# Patient Record
Sex: Female | Born: 1995
Health system: Southern US, Community
[De-identification: ages and names within clinical notes are randomized; demographics above are authoritative.]

## PROBLEM LIST (undated history)

## (undated) DIAGNOSIS — I10 Essential (primary) hypertension: Secondary | ICD-10-CM

## (undated) DIAGNOSIS — A749 Chlamydial infection, unspecified: Secondary | ICD-10-CM

## (undated) HISTORY — PX: FOOT SURGERY: SHX648

## (undated) HISTORY — DX: Chlamydial infection, unspecified: A74.9

## (undated) HISTORY — DX: Essential (primary) hypertension: I10

---
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
## 2015-08-19 ENCOUNTER — Encounter (HOSPITAL_COMMUNITY): Payer: Self-pay | Admitting: Emergency Medicine

## 2015-08-19 ENCOUNTER — Emergency Department (INDEPENDENT_AMBULATORY_CARE_PROVIDER_SITE_OTHER)
Admission: EM | Admit: 2015-08-19 | Discharge: 2015-08-19 | Disposition: A | Discharge status: Home / Self Care | Payer: PRIVATE HEALTH INSURANCE | Source: Home / Self Care | Attending: Family Medicine | Admitting: Family Medicine

## 2015-08-19 ENCOUNTER — Other Ambulatory Visit (HOSPITAL_COMMUNITY)
Admission: RE | Admit: 2015-08-19 | Discharge: 2015-08-19 | Disposition: A | Discharge status: Home / Self Care | Payer: PRIVATE HEALTH INSURANCE | Source: Ambulatory Visit | Attending: Family Medicine | Admitting: Family Medicine

## 2015-08-19 DIAGNOSIS — R0789 Other chest pain: Secondary | ICD-10-CM

## 2015-08-19 DIAGNOSIS — Z113 Encounter for screening for infections with a predominantly sexual mode of transmission: Secondary | ICD-10-CM | POA: Insufficient documentation

## 2015-08-19 DIAGNOSIS — B9689 Other specified bacterial agents as the cause of diseases classified elsewhere: Secondary | ICD-10-CM

## 2015-08-19 DIAGNOSIS — M940 Chondrocostal junction syndrome [Tietze]: Secondary | ICD-10-CM

## 2015-08-19 DIAGNOSIS — N898 Other specified noninflammatory disorders of vagina: Secondary | ICD-10-CM | POA: Diagnosis not present

## 2015-08-19 DIAGNOSIS — N760 Acute vaginitis: Secondary | ICD-10-CM

## 2015-08-19 DIAGNOSIS — R102 Pelvic and perineal pain: Secondary | ICD-10-CM | POA: Diagnosis not present

## 2015-08-19 DIAGNOSIS — N72 Inflammatory disease of cervix uteri: Secondary | ICD-10-CM

## 2015-08-19 DIAGNOSIS — A499 Bacterial infection, unspecified: Secondary | ICD-10-CM

## 2015-08-19 LAB — POCT URINALYSIS DIP (DEVICE)
Bilirubin Urine: NEGATIVE
Glucose, UA: NEGATIVE mg/dL
HGB URINE DIPSTICK: NEGATIVE
Ketones, ur: NEGATIVE mg/dL
LEUKOCYTES UA: NEGATIVE
NITRITE: NEGATIVE
Protein, ur: NEGATIVE mg/dL
Specific Gravity, Urine: 1.02 (ref 1.005–1.030)
UROBILINOGEN UA: 0.2 mg/dL (ref 0.0–1.0)
pH: 6 (ref 5.0–8.0)

## 2015-08-19 LAB — POCT PREGNANCY, URINE: Preg Test, Ur: NEGATIVE

## 2015-08-19 MED ORDER — METRONIDAZOLE 500 MG PO TABS: 500.0000 mg | ORAL_TABLET | Freq: Two times a day (BID) | ORAL | Status: DC

## 2015-08-19 MED ORDER — CEFTRIAXONE SODIUM 250 MG IJ SOLR
250.0000 mg | Freq: Once | INTRAMUSCULAR | Status: AC
  Administered 2015-08-19: 250 mg via INTRAMUSCULAR

## 2015-08-19 MED ORDER — LIDOCAINE HCL (PF) 1 % IJ SOLN
INTRAMUSCULAR | Status: AC
  Filled 2015-08-19: qty 5

## 2015-08-19 MED ORDER — AZITHROMYCIN 250 MG PO TABS
ORAL_TABLET | ORAL | Status: AC
  Filled 2015-08-19: qty 4

## 2015-08-19 MED ORDER — AZITHROMYCIN 250 MG PO TABS
1000.0000 mg | ORAL_TABLET | Freq: Every day | ORAL | Status: DC
  Administered 2015-08-19: 1000 mg via ORAL

## 2015-08-19 MED ORDER — CEFTRIAXONE SODIUM 250 MG IJ SOLR
INTRAMUSCULAR | Status: AC
  Filled 2015-08-19: qty 250
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
## 2015-08-19 NOTE — ED Provider Notes (Signed)
CSN: [id number]     Arrival date & time 08/19/15  1338 History   First MD Initiated Contact with Patient 08/19/15 1533     Chief Complaint  Patient presents with  . Chest Pain   (Consider location/radiation/quality/duration/timing/severity/associated sxs/prior Treatment) HPI Comments: 19 year old female presents with 2 complaints. 1. First complaint is that of right chest wall pain. Pain is well localized and nonradiating. Located to the right of the sternal border. It is made worse by taking deep breaths, abducting the right arm. She describes it as sharp. He comes and goes. It started approximately 3-4 days ago. No history of trauma or injury.  Second complaint is that of pelvic pain for a week. She has a scant amount of milky discharge. She describes the pelvic pain has cramping and intermittent. Denies dysuria but has urinary frequency. She is currently using X [place] on S birth control.   History reviewed. No pertinent past medical history. History reviewed. No pertinent past surgical history. No family history on file. Social History  Substance Use Topics  . Smoking status: Never Smoker   . Smokeless tobacco: None  . Alcohol Use: No   OB History    No data available     Review of Systems  Constitutional: Negative for fever, activity change and fatigue.  HENT: Negative.   Respiratory: Negative.   Cardiovascular: Positive for chest pain. Negative for leg swelling.  Gastrointestinal: Negative.   Genitourinary: Positive for frequency, vaginal discharge and pelvic pain. Negative for dysuria, urgency, hematuria, decreased urine volume and vaginal bleeding.  Musculoskeletal: Negative.   Neurological: Negative.     Allergies  Review of patient's allergies indicates no known allergies.  Home Medications   Prior to Admission medications   Medication Sig Start Date End Date Taking? Authorizing Provider  methocarbamol (ROBAXIN) 500 MG tablet Take 1 tablet (500 mg total) by mouth  2 (two) times daily. 09/03/13   Heather Laisure, PA-C  metroNIDAZOLE (FLAGYL) 500 MG tablet Take 1 tablet (500 mg total) by mouth 2 (two) times daily. X 7 days 08/19/15   Hayden Rasmussen, NP  naproxen (NAPROSYN) 500 MG tablet Take 1 tablet (500 mg total) by mouth 2 (two) times daily. 09/03/13   Santiago Glad, PA-C   Meds Ordered and Administered this Visit   Medications  cefTRIAXone (ROCEPHIN) injection 250 mg (not administered)  azithromycin (ZITHROMAX) tablet 1,000 mg (not administered)    BP 137/90 mmHg  Pulse 89  Temp(Src) 98.2 F (36.8 C) (Oral)  Resp 16  SpO2 100%  LMP 06/02/2015 No data found.   Physical Exam  Constitutional: She is oriented to person, place, and time. She appears well-developed and well-nourished. No distress.  Eyes: Conjunctivae and EOM are normal.  Neck: Normal range of motion. Neck supple.  Cardiovascular: Normal rate, regular rhythm and normal heart sounds.   Pulmonary/Chest: Effort normal and breath sounds normal. No respiratory distress. She has no wheezes. She exhibits tenderness.  Palpation over the right upper sternal border and right upper chest reproduces the pain for which she presents.  Abdominal: Soft. Bowel sounds are normal. She exhibits no distension. There is no tenderness. There is no guarding.  Anterior palpation of the suprapubic midline produces mild tenderness.  Genitourinary: Vaginal discharge found.  Normal external female genital with a small amount of thin white vaginal discharge draining from the introitus. Vaginal walls and cervix coated with a copious amount of thin white malodorous vaginal discharge.  Cervix left of midline. Ectocervix with minor erythema. No lesions.  Positive for CMT and bilateral adnexal tenderness.   Musculoskeletal: She exhibits no edema.  Neurological: She is alert and oriented to person, place, and time. She exhibits normal muscle tone.  Skin: Skin is warm and dry.  Psychiatric: She has a normal mood and  affect.  Nursing note and vitals reviewed.   ED Course  Procedures (including critical care time)  Labs Review Labs Reviewed  POCT URINALYSIS DIP (DEVICE)  POCT PREGNANCY, URINE  CERVICOVAGINAL ANCILLARY ONLY   Results for orders placed or performed during the hospital encounter of 08/19/15  POCT urinalysis dip (device)  Result Value Ref Range   Glucose, UA NEGATIVE NEGATIVE mg/dL   Bilirubin Urine NEGATIVE NEGATIVE   Ketones, ur NEGATIVE NEGATIVE mg/dL   Specific Gravity, Urine 1.020 1.005 - 1.030   Hgb urine dipstick NEGATIVE NEGATIVE   pH 6.0 5.0 - 8.0   Protein, ur NEGATIVE NEGATIVE mg/dL   Urobilinogen, UA 0.2 0.0 - 1.0 mg/dL   Nitrite NEGATIVE NEGATIVE   Leukocytes, UA NEGATIVE NEGATIVE  Pregnancy, urine POC  Result Value Ref Range   Preg Test, Ur NEGATIVE NEGATIVE     Imaging Review No results found.   Visual Acuity Review  Right Eye Distance:   Left Eye Distance:   Bilateral Distance:    Right Eye Near:   Left Eye Near:    Bilateral Near:         MDM   1. Chest wall pain   2. Costochondritis   3. Vaginal discharge   4. Pelvic pain in female   5. Cervicitis   6. BV (bacterial vaginosis)    Rocephin 250 mg IM Azithromycin 1 g by mouth Flagyl 500 mg twice a day per Rx Ice to the front of the chest where it is sore May take ibuprofen or Aleve for chest wall pain. Cervical cytology pending   Hayden Rasmussen, NP 08/19/15 [phone]

## 2015-08-19 NOTE — Discharge Instructions (Signed)
Cervicitis Cervicitis is a soreness and swelling (inflammation) of the cervix. Your cervix is located at the bottom of your uterus. It opens up to the vagina. CAUSES   Sexually transmitted infections (STIs).   Allergic reaction.   Medicines or birth control devices that are put in the vagina.   Injury to the cervix.   Bacterial infections.  RISK FACTORS You are at greater risk if you:  Have unprotected sexual intercourse.  Have sexual intercourse with many partners.  Began sexual intercourse at an early age.  Have a history of STIs. SYMPTOMS  There may be no symptoms. If symptoms occur, they may include:   Gray, white, yellow, or bad-smelling vaginal discharge.   Pain or itching of the area outside the vagina.   Painful sexual intercourse.   Lower abdominal or lower back pain, especially during intercourse.   Frequent urination.   Abnormal vaginal bleeding between periods, after sexual intercourse, or after menopause.   Pressure or a heavy feeling in the pelvis.  DIAGNOSIS  Diagnosis is made after a pelvic exam. Other tests may include:   Examination of any discharge under a microscope (wet prep).   A Pap test.  TREATMENT  Treatment will depend on the cause of cervicitis. If it is caused by an STI, both you and your partner will need to be treated. Antibiotic medicines will be given.  HOME CARE INSTRUCTIONS   Do not have sexual intercourse until your health care provider says it is okay.   Do not have sexual intercourse until your partner has been treated, if your cervicitis is caused by an STI.   Take your antibiotics as directed. Finish them even if you start to feel better.  SEEK MEDICAL CARE IF:  Your symptoms come back.   You have a fever.  MAKE SURE YOU:   Understand these instructions.  Will watch your condition.  Will get help right away if you are not doing well or get worse. Document Released: 11/02/2005 Document Revised:  11/07/2013 Document Reviewed: 04/26/2013 [facility] Patient Information 2015 [place], [place]. This information is not intended to replace advice given to you by your health care provider. Make sure you discuss any questions you have with your health care provider.  Chest Wall Pain Chest wall pain is pain in or around the bones and muscles of your chest. It may take up to 6 weeks to get better. It may take longer if you must stay physically active in your work and activities.  CAUSES  Chest wall pain may happen on its own. However, it may be caused by:  A viral illness like the flu.  Injury.  Coughing.  Exercise.  Arthritis.  Fibromyalgia.  Shingles. HOME CARE INSTRUCTIONS   Avoid overtiring physical activity. Try not to strain or perform activities that cause pain. This includes any activities using your chest or your abdominal and side muscles, especially if heavy weights are used.  Put ice on the sore area.  Put ice in a plastic bag.  Place a towel between your skin and the bag.  Leave the ice on for 15-20 minutes per hour while awake for the first 2 days.  Only take over-the-counter or prescription medicines for pain, discomfort, or fever as directed by your caregiver. SEEK IMMEDIATE MEDICAL CARE IF:   Your pain increases, or you are very uncomfortable.  You have a fever.  Your chest pain becomes worse.  You have new, unexplained symptoms.  You have nausea or vomiting.  You feel  sweaty or lightheaded.  You have a cough with phlegm (sputum), or you cough up blood. MAKE SURE YOU:   Understand these instructions.  Will watch your condition.  Will get help right away if you are not doing well or get worse. Document Released: 11/02/2005 Document Revised: 01/25/2012 Document Reviewed: 06/29/2011 [facility] Patient Information 2015 [place], [place]. This information is not intended to replace advice given to you by your health care provider. Make sure you discuss any  questions you have with your health care provider.  Costochondritis Costochondritis, sometimes called Tietze syndrome, is a swelling and irritation (inflammation) of the tissue (cartilage) that connects your ribs with your breastbone (sternum). It causes pain in the chest and rib area. Costochondritis usually goes away on its own over time. It can take up to 6 weeks or longer to get better, especially if you are unable to limit your activities. CAUSES  Some cases of costochondritis have no known cause. Possible causes include:  Injury (trauma).  Exercise or activity such as lifting.  Severe coughing. SIGNS AND SYMPTOMS  Pain and tenderness in the chest and rib area.  Pain that gets worse when coughing or taking deep breaths.  Pain that gets worse with specific movements. DIAGNOSIS  Your health care provider will do a physical exam and ask about your symptoms. Chest X-rays or other tests may be done to rule out other problems. TREATMENT  Costochondritis usually goes away on its own over time. Your health care provider may prescribe medicine to help relieve pain. HOME CARE INSTRUCTIONS   Avoid exhausting physical activity. Try not to strain your ribs during normal activity. This would include any activities using chest, abdominal, and side muscles, especially if heavy weights are used.  Apply ice to the affected area for the first 2 days after the pain begins.  Put ice in a plastic bag.  Place a towel between your skin and the bag.  Leave the ice on for 20 minutes, 2-3 times a day.  Only take over-the-counter or prescription medicines as directed by your health care provider. SEEK MEDICAL CARE IF:  You have redness or swelling at the rib joints. These are signs of infection.  Your pain does not go away despite rest or medicine. SEEK IMMEDIATE MEDICAL CARE IF:   Your pain increases or you are very uncomfortable.  You have shortness of breath or difficulty breathing.  You  cough up blood.  You have worse chest pains, sweating, or vomiting.  You have a fever or persistent symptoms for more than 2-3 days.  You have a fever and your symptoms suddenly get worse. MAKE SURE YOU:   Understand these instructions.  Will watch your condition.  Will get help right away if you are not doing well or get worse. Document Released: 08/12/2005 Document Revised: 08/23/2013 Document Reviewed: 06/06/2013 [facility] Patient Information 2015 [place], [place]. This information is not intended to replace advice given to you by your health care provider. Make sure you discuss any questions you have with your health care provider.  Pelvic Inflammatory Disease Pelvic inflammatory disease (PID) refers to an infection in some or all of the female organs. The infection can be in the uterus, ovaries, fallopian tubes, or the surrounding tissues in the pelvis. PID can cause abdominal or pelvic pain that comes on suddenly (acute pelvic pain). PID is a serious infection because it can lead to lasting (chronic) pelvic pain or the inability to have children (infertile).  CAUSES  The infection is often  caused by the normal bacteria found in the vaginal tissues. PID may also be caused by an infection that is spread during sexual contact. PID can also occur following:   The birth of a baby.   A miscarriage.   An abortion.   Major pelvic surgery.   The use of an intrauterine device (IUD).   A sexual assault.  RISK FACTORS Certain factors can put a person at higher risk for PID, such as:  Being younger than 25 years.  Being sexually active at [place] age.  Usingnonbarrier contraception.  Havingmultiple sexual partners.  Having sex with someone who has symptoms of a genital infection.  Using oral contraception. Other times, certain behaviors can increase the possibility of getting PID, such as:  Having sex during your period.  Using a vaginal douche.  Having an  intrauterine device (IUD) in place. SYMPTOMS   Abdominal or pelvic pain.   Fever.   Chills.   Abnormal vaginal discharge.  Abnormal uterine bleeding.   Unusual pain shortly after finishing your period. DIAGNOSIS  Your caregiver will choose some of the following methods to make a diagnosis, such as:   Performinga physical exam and history. A pelvic exam typically reveals a very tender uterus and surrounding pelvis.   Ordering laboratory tests including a pregnancy test, blood tests, and urine test.  Orderingcultures of the vagina and cervix to check for a sexually transmitted infection (STI).  Performing an ultrasound.   Performing a laparoscopic procedure to look inside the pelvis.  TREATMENT   Antibiotic medicines may be prescribed and taken by mouth.   Sexual partners may be treated when the infection is caused by a sexually transmitted disease (STD).   Hospitalization may be needed to give antibiotics intravenously.  Surgery may be needed, but this is rare. It may take weeks until you are completely well. If you are diagnosed with PID, you should also be checked for human immunodeficiency virus (HIV). HOME CARE INSTRUCTIONS   If given, take your antibiotics as directed. Finish the medicine even if you start to feel better.   Only take over-the-counter or prescription medicines for pain, discomfort, or fever as directed by your caregiver.   Do not have sexual intercourse until treatment is completed or as directed by your caregiver. If PID is confirmed, your recent sexual partner(s) will need treatment.   Keep your follow-up appointments. SEEK MEDICAL CARE IF:   You have increased or abnormal vaginal discharge.   You need prescription medicine for your pain.   You vomit.   You cannot take your medicines.   Your partner has an STD.  SEEK IMMEDIATE MEDICAL CARE IF:   You have a fever.   You have increased abdominal or pelvic pain.    You have chills.   You have pain when you urinate.   You are not better after 72 hours following treatment.  MAKE SURE YOU:   Understand these instructions.  Will watch your condition.  Will get help right away if you are not doing well or get worse. Document Released: 11/02/2005 Document Revised: 02/27/2013 Document Reviewed: 10/29/2011 [facility] Patient Information 2015 [place], [place]. This information is not intended to replace advice given to you by your health care provider. Make sure you discuss any questions you have with your health care provider.

## 2015-08-19 NOTE — ED Notes (Signed)
Delay in patient discharge, no ready for discharge.  Patient delayed post injection wait and assessment for possible reaction

## 2015-08-20 LAB — CERVICOVAGINAL ANCILLARY ONLY
Chlamydia: NEGATIVE
Neisseria Gonorrhea: NEGATIVE

## 2015-08-21 LAB — CERVICOVAGINAL ANCILLARY ONLY: Wet Prep (BD Affirm): POSITIVE — AB

## 2015-08-26 NOTE — ED Notes (Signed)
Final report of vaginal swab testing positive for gardnerella only. Treatment adequate w Rx provided day of visit

## 2018-04-28 DIAGNOSIS — J019 Acute sinusitis, unspecified: Secondary | ICD-10-CM | POA: Diagnosis not present

## 2018-05-16 DIAGNOSIS — Z113 Encounter for screening for infections with a predominantly sexual mode of transmission: Secondary | ICD-10-CM | POA: Diagnosis not present

## 2018-05-16 DIAGNOSIS — Z01419 Encounter for gynecological examination (general) (routine) without abnormal findings: Secondary | ICD-10-CM | POA: Diagnosis not present

## 2018-05-16 DIAGNOSIS — Z114 Encounter for screening for human immunodeficiency virus [HIV]: Secondary | ICD-10-CM | POA: Diagnosis not present

## 2018-05-16 DIAGNOSIS — Z1159 Encounter for screening for other viral diseases: Secondary | ICD-10-CM | POA: Diagnosis not present

## 2018-05-16 DIAGNOSIS — I272 Pulmonary hypertension, unspecified: Secondary | ICD-10-CM | POA: Insufficient documentation

## 2018-05-16 DIAGNOSIS — Z118 Encounter for screening for other infectious and parasitic diseases: Secondary | ICD-10-CM | POA: Diagnosis not present

## 2018-05-16 DIAGNOSIS — I10 Essential (primary) hypertension: Secondary | ICD-10-CM | POA: Diagnosis not present

## 2018-05-16 DIAGNOSIS — Z6836 Body mass index (BMI) 36.0-36.9, adult: Secondary | ICD-10-CM | POA: Diagnosis not present

## 2018-06-14 NOTE — Progress Notes (Deleted)
Referring-Sheronette Cousins MD Reason for referral-hypertension  HPI: [age] year old female for evaluation of hypertension at request of Maxie BetterSheronette Cousins MD.   Current Outpatient Medications  Medication Sig Dispense Refill  . methocarbamol (ROBAXIN) 500 MG tablet Take 1 tablet (500 mg total) by mouth 2 (two) times daily. 20 tablet 0  . metroNIDAZOLE (FLAGYL) 500 MG tablet Take 1 tablet (500 mg total) by mouth 2 (two) times daily. X 7 days 14 tablet 0  . naproxen (NAPROSYN) 500 MG tablet Take 1 tablet (500 mg total) by mouth 2 (two) times daily. 30 tablet 0   No current facility-administered medications for this visit.     No Known Allergies  Past Medical History:  Diagnosis Date  . Chlamydia   . Hypertension   . Pulmonary hypertension, unspecified (HCC) 06/07/2018    No past surgical history on file.  Social History   Socioeconomic History  . Marital status: Single    Spouse name: Not on file  . Number of children: Not on file  . Years of education: Not on file  . Highest education level: Not on file  Occupational History    Employer: [employer]  Social Needs  . Financial resource strain: Not on file  . Food insecurity:    Worry: Not on file    Inability: Not on file  . Transportation needs:    Medical: Not on file    Non-medical: Not on file  Tobacco Use  . Smoking status: Never Smoker  . Smokeless tobacco: Never Used  Substance and Sexual Activity  . Alcohol use: Yes    Comment: occasional use  . Drug use: No  . Sexual activity: Not Currently    Partners: Male    Birth control/protection: Abstinence  Lifestyle  . Physical activity:    Days per week: Not on file    Minutes per session: Not on file  . Stress: Not on file  Relationships  . Social connections:    Talks on phone: Not on file    Gets together: Not on file    Attends religious service: Not on file    Active member of club or organization: Not on file    Attends meetings of clubs or  organizations: Not on file    Relationship status: Not on file  . Intimate partner violence:    Fear of current or ex partner: No    Emotionally abused: No    Physically abused: No    Forced sexual activity: No  Other Topics Concern  . Not on file  Social History Narrative  . Not on file    Family History  Problem Relation Age of Onset  . Diabetes Mother   . Hypertension Father   . Breast cancer Neg Hx   . Colon cancer Neg Hx   . Ovarian cancer Neg Hx   . Thyroid disease Neg Hx     ROS: no fevers or chills, productive cough, hemoptysis, dysphasia, odynophagia, melena, hematochezia, dysuria, hematuria, rash, seizure activity, orthopnea, PND, pedal edema, claudication. Remaining systems are negative.  Physical Exam:   There were no vitals taken for this visit.  General:  Well developed/well nourished in NAD Skin warm/dry Patient not depressed No peripheral clubbing Back-normal HEENT-normal/normal eyelids Neck supple/normal carotid upstroke bilaterally; no bruits; no JVD; no thyromegaly chest - CTA/ normal expansion CV - RRR/normal S1 and S2; no murmurs, rubs or gallops;  PMI nondisplaced Abdomen -NT/ND, no HSM, no mass, + bowel sounds, no bruit  2+ femoral pulses, no bruits Ext-no edema, chords, 2+ DP Neuro-grossly nonfocal  ECG - personally reviewed  A/P  1  Olga Millers, MD

## 2018-06-20 ENCOUNTER — Ambulatory Visit: Payer: PRIVATE HEALTH INSURANCE | Admitting: Cardiology

## 2018-07-25 NOTE — Progress Notes (Signed)
Referring-Cheryl Cousins MD Reason for referral-hypertension  HPI: 22 year old female for evaluation of hypertension at request of Maxie Better MD. laboratories July 2019 showed BUN 12, creatinine 0.8, potassium 4.4, sodium 142.  Patient denies dyspnea, chest pain, palpitations or syncope.  Both of her parents have high blood pressure.  Cardiology now asked to evaluate.  No current outpatient medications on file.   No current facility-administered medications for this visit.     No Known Allergies   Past Medical History:  Diagnosis Date  . Chlamydia   . Hypertension     Past Surgical History:  Procedure Laterality Date  . FOOT SURGERY      Social History   Socioeconomic History  . Marital status: Single    Spouse name: Not on file  . Number of children: Not on file  . Years of education: Not on file  . Highest education level: Not on file  Occupational History    Employer: [employer]  Social Needs  . Financial resource strain: Not on file  . Food insecurity:    Worry: Not on file    Inability: Not on file  . Transportation needs:    Medical: Not on file    Non-medical: Not on file  Tobacco Use  . Smoking status: Current Every Day Smoker  . Smokeless tobacco: Never Used  Substance and Sexual Activity  . Alcohol use: Yes    Comment: occasional use  . Drug use: No  . Sexual activity: Not Currently    Partners: Male    Birth control/protection: Abstinence  Lifestyle  . Physical activity:    Days per week: Not on file    Minutes per session: Not on file  . Stress: Not on file  Relationships  . Social connections:    Talks on phone: Not on file    Gets together: Not on file    Attends religious service: Not on file    Active member of club or organization: Not on file    Attends meetings of clubs or organizations: Not on file    Relationship status: Not on file  . Intimate partner violence:    Fear of current or ex partner: No    Emotionally  abused: No    Physically abused: No    Forced sexual activity: No  Other Topics Concern  . Not on file  Social History Narrative  . Not on file    Family History  Problem Relation Age of Onset  . Diabetes Mother   . Heart failure Mother   . Hypertension Father   . Breast cancer Neg Hx   . Colon cancer Neg Hx   . Ovarian cancer Neg Hx   . Thyroid disease Neg Hx     ROS: no fevers or chills, productive cough, hemoptysis, dysphasia, odynophagia, melena, hematochezia, dysuria, hematuria, rash, seizure activity, orthopnea, PND, pedal edema, claudication. Remaining systems are negative.  Physical Exam:   Blood pressure (!) 138/100, pulse 88, height 5\' 11"  (1.803 m), weight 240 lb (108.9 kg).  General:  Well developed/well nourished in NAD Skin warm/dry Patient not depressed No peripheral clubbing Back-normal HEENT-normal/normal eyelids Neck supple/normal carotid upstroke bilaterally; no bruits; no JVD; no thyromegaly chest - CTA/ normal expansion CV - RRR/normal S1 and S2; no murmurs, rubs or gallops;  PMI nondisplaced Abdomen -NT/ND, no HSM, no mass, + bowel sounds, no bruit 2+ femoral pulses, no bruits Ext-no edema, chords, 2+ DP Neuro-grossly nonfocal  ECG -normal sinus rhythm,  nonspecific ST changes.  Personally reviewed  A/P  1 hypertension-patient's blood pressure is elevated.  We reviewed lifestyle modification including low-sodium diet, tobacco discontinuation, exercise, weight loss.  She does not consume alcohol to excess.  I will add hydrochlorothiazide 25 mg daily.  In 1 week check potassium and renal function.  2 tobacco abuse-she occasionally smokes.  I have counseled her on discontinuing.  Olga Millers, MD

## 2018-07-26 ENCOUNTER — Encounter: Payer: Self-pay | Admitting: Cardiology

## 2018-07-26 ENCOUNTER — Ambulatory Visit: Payer: BLUE CROSS/BLUE SHIELD | Admitting: Cardiology

## 2018-07-26 VITALS — BP 138/100 | HR 88 | Ht 71.0 in | Wt 240.0 lb

## 2018-07-26 DIAGNOSIS — I10 Essential (primary) hypertension: Secondary | ICD-10-CM | POA: Diagnosis not present

## 2018-07-26 DIAGNOSIS — Z79899 Other long term (current) drug therapy: Secondary | ICD-10-CM

## 2018-07-26 DIAGNOSIS — I272 Pulmonary hypertension, unspecified: Secondary | ICD-10-CM

## 2018-07-26 MED ORDER — HYDROCHLOROTHIAZIDE 25 MG PO TABS: 25.0000 mg | ORAL_TABLET | Freq: Every day | ORAL | 3 refills | Status: DC

## 2018-07-26 NOTE — Patient Instructions (Signed)
Medication Instructions: Your Physician recommend you make the following changes to your medication. Start: HCTZ 25 mg daily   If you need a refill on your cardiac medications before your next appointment, please call your pharmacy.   Labwork: Your physician recommends that you return for lab work in 1 week ( BMP)   Procedures/Testing: None  Follow-Up: Your physician wants you to follow-up in 3 months with Dr. Jens Som.  Special Instructions:    Thank you for choosing [facility] at [facility]!!

## 2018-08-10 DIAGNOSIS — L0591 Pilonidal cyst without abscess: Secondary | ICD-10-CM | POA: Diagnosis not present

## 2018-08-12 DIAGNOSIS — L0591 Pilonidal cyst without abscess: Secondary | ICD-10-CM | POA: Diagnosis not present

## 2018-10-31 DIAGNOSIS — Z3A01 Less than 8 weeks gestation of pregnancy: Secondary | ICD-10-CM | POA: Diagnosis not present

## 2018-10-31 DIAGNOSIS — O26891 Other specified pregnancy related conditions, first trimester: Secondary | ICD-10-CM | POA: Diagnosis not present

## 2018-10-31 DIAGNOSIS — N939 Abnormal uterine and vaginal bleeding, unspecified: Secondary | ICD-10-CM | POA: Diagnosis not present

## 2018-10-31 DIAGNOSIS — O209 Hemorrhage in early pregnancy, unspecified: Secondary | ICD-10-CM | POA: Diagnosis not present

## 2018-10-31 DIAGNOSIS — R102 Pelvic and perineal pain: Secondary | ICD-10-CM | POA: Diagnosis not present

## 2018-10-31 DIAGNOSIS — Z3A00 Weeks of gestation of pregnancy not specified: Secondary | ICD-10-CM | POA: Diagnosis not present

## 2018-11-02 ENCOUNTER — Other Ambulatory Visit: Payer: Self-pay | Admitting: Obstetrics and Gynecology

## 2018-11-03 NOTE — Progress Notes (Deleted)
      HPI: Follow-up hypertension.  Hydrochlorothiazide added at last office visit.  Since last seen  Current Outpatient Medications  Medication Sig Dispense Refill  . hydrochlorothiazide (HYDRODIURIL) 25 MG tablet Take 1 tablet (25 mg total) by mouth daily. 90 tablet 3   No current facility-administered medications for this visit.      Past Medical History:  Diagnosis Date  . Chlamydia   . Hypertension     Past Surgical History:  Procedure Laterality Date  . FOOT SURGERY      Social History   Socioeconomic History  . Marital status: Single    Spouse name: Not on file  . Number of children: Not on file  . Years of education: Not on file  . Highest education level: Not on file  Occupational History    Employer: [employer]  Social Needs  . Financial resource strain: Not on file  . Food insecurity:    Worry: Not on file    Inability: Not on file  . Transportation needs:    Medical: Not on file    Non-medical: Not on file  Tobacco Use  . Smoking status: Current Every Day Smoker  . Smokeless tobacco: Never Used  Substance and Sexual Activity  . Alcohol use: Yes    Comment: occasional use  . Drug use: No  . Sexual activity: Not Currently    Partners: Male    Birth control/protection: Abstinence  Lifestyle  . Physical activity:    Days per week: Not on file    Minutes per session: Not on file  . Stress: Not on file  Relationships  . Social connections:    Talks on phone: Not on file    Gets together: Not on file    Attends religious service: Not on file    Active member of club or organization: Not on file    Attends meetings of clubs or organizations: Not on file    Relationship status: Not on file  . Intimate partner violence:    Fear of current or ex partner: No    Emotionally abused: No    Physically abused: No    Forced sexual activity: No  Other Topics Concern  . Not on file  Social History Narrative  . Not on file    Family History  Problem  Relation Age of Onset  . Diabetes Mother   . Heart failure Mother   . Hypertension Father   . Breast cancer Neg Hx   . Colon cancer Neg Hx   . Ovarian cancer Neg Hx   . Thyroid disease Neg Hx     ROS: no fevers or chills, productive cough, hemoptysis, dysphasia, odynophagia, melena, hematochezia, dysuria, hematuria, rash, seizure activity, orthopnea, PND, pedal edema, claudication. Remaining systems are negative.  Physical Exam: Well-developed well-nourished in no acute distress.  Skin is warm and dry.  HEENT is normal.  Neck is supple.  Chest is clear to auscultation with normal expansion.  Cardiovascular exam is regular rate and rhythm.  Abdominal exam nontender or distended. No masses palpated. Extremities show no edema. neuro grossly intact  ECG- personally reviewed  A/P  1 hypertension-  2 tobacco abuse-patient has discontinued.  Olga MillersBrian Crenshaw, MD

## 2018-11-06 DIAGNOSIS — L0231 Cutaneous abscess of buttock: Secondary | ICD-10-CM | POA: Diagnosis not present

## 2018-11-15 ENCOUNTER — Ambulatory Visit: Payer: BLUE CROSS/BLUE SHIELD | Admitting: Cardiology

## 2018-11-15 DIAGNOSIS — R0989 Other specified symptoms and signs involving the circulatory and respiratory systems: Secondary | ICD-10-CM

## 2018-11-16 NOTE — L&D Delivery Note (Signed)
Delivery Note At 8:53 AM a viable and healthy female was delivered via Vaginal, Spontaneous (Presentation:vtx ;  ROA).  APGAR: 6, 8; weight  pending.   Placenta status:spontaneous, intact not sent , .  Cord:  CAN x 1 reducible with the following complications: .  Cord pH: n/a  Anesthesia:  epidural Episiotomy: None Lacerations: Sulcus;Vaginal left Suture Repair: 3.0 chromic Est. Blood Loss (mL): 645  Mom to postpartum.  Baby to Couplet care / Skin to Skin.  Cheryl Blair [name] 06/10/2019, 9:52 AM

## 2018-11-22 DIAGNOSIS — Z3201 Encounter for pregnancy test, result positive: Secondary | ICD-10-CM | POA: Diagnosis not present

## 2018-11-23 ENCOUNTER — Telehealth: Payer: Self-pay | Admitting: Cardiology

## 2018-11-23 NOTE — Telephone Encounter (Signed)
New Message   Pt c/o medication issue:  1. Name of Medication: Hydro Chlorothiacide  2. How are you currently taking this medication (dosage and times per day)? 25mg  once a day  3. Are you having a reaction (difficulty breathing--STAT)? No  4. What is your medication issue? Patients OBGYN wants her to stop taking it since she's pregnant now and wants to make sure it's ok for her to do so.

## 2018-11-23 NOTE — Telephone Encounter (Signed)
Okay to DC hydrochlorothiazide and follow blood pressure Cheryl Blair

## 2018-11-23 NOTE — Telephone Encounter (Signed)
Spoke with pt, Aware of dr crenshaw's recommendations.  °

## 2018-11-23 NOTE — Telephone Encounter (Signed)
Spoke with pt who states she was recommended by her OBGYN to stop taking HCTZ as he is currently pregnant and "at risk for preeclampsia." She reports her BP has been WNL. Will route to MD

## 2018-11-28 ENCOUNTER — Other Ambulatory Visit: Payer: Self-pay | Admitting: Obstetrics and Gynecology

## 2018-11-28 ENCOUNTER — Encounter: Payer: Self-pay | Admitting: Cardiology

## 2018-11-28 DIAGNOSIS — O10019 Pre-existing essential hypertension complicating pregnancy, unspecified trimester: Secondary | ICD-10-CM | POA: Diagnosis not present

## 2018-11-28 DIAGNOSIS — Z23 Encounter for immunization: Secondary | ICD-10-CM | POA: Diagnosis not present

## 2018-11-28 DIAGNOSIS — Z3689 Encounter for other specified antenatal screening: Secondary | ICD-10-CM | POA: Diagnosis not present

## 2018-11-28 DIAGNOSIS — Z3A09 9 weeks gestation of pregnancy: Secondary | ICD-10-CM | POA: Diagnosis not present

## 2018-11-28 DIAGNOSIS — O10011 Pre-existing essential hypertension complicating pregnancy, first trimester: Secondary | ICD-10-CM | POA: Diagnosis not present

## 2018-11-28 LAB — OB RESULTS CONSOLE RPR: RPR: NONREACTIVE

## 2018-11-28 LAB — OB RESULTS CONSOLE ABO/RH: RH Type: POSITIVE

## 2018-11-28 LAB — OB RESULTS CONSOLE HEPATITIS B SURFACE ANTIGEN: Hepatitis B Surface Ag: NEGATIVE

## 2018-11-28 LAB — OB RESULTS CONSOLE RUBELLA ANTIBODY, IGM: Rubella: IMMUNE

## 2018-11-28 LAB — OB RESULTS CONSOLE ANTIBODY SCREEN: Antibody Screen: NEGATIVE

## 2018-11-28 LAB — OB RESULTS CONSOLE HIV ANTIBODY (ROUTINE TESTING): HIV: NONREACTIVE

## 2018-11-29 DIAGNOSIS — Z3689 Encounter for other specified antenatal screening: Secondary | ICD-10-CM | POA: Diagnosis not present

## 2018-11-29 DIAGNOSIS — Z3A09 9 weeks gestation of pregnancy: Secondary | ICD-10-CM | POA: Diagnosis not present

## 2018-11-29 DIAGNOSIS — Z3401 Encounter for supervision of normal first pregnancy, first trimester: Secondary | ICD-10-CM | POA: Diagnosis not present

## 2018-11-29 DIAGNOSIS — O10019 Pre-existing essential hypertension complicating pregnancy, unspecified trimester: Secondary | ICD-10-CM | POA: Diagnosis not present

## 2018-11-29 DIAGNOSIS — O10011 Pre-existing essential hypertension complicating pregnancy, first trimester: Secondary | ICD-10-CM | POA: Diagnosis not present

## 2018-11-29 DIAGNOSIS — Z118 Encounter for screening for other infectious and parasitic diseases: Secondary | ICD-10-CM | POA: Diagnosis not present

## 2018-11-29 LAB — OB RESULTS CONSOLE GC/CHLAMYDIA
Chlamydia: NEGATIVE
Gonorrhea: NEGATIVE

## 2018-12-12 DIAGNOSIS — Z3689 Encounter for other specified antenatal screening: Secondary | ICD-10-CM | POA: Diagnosis not present

## 2018-12-12 DIAGNOSIS — Z3401 Encounter for supervision of normal first pregnancy, first trimester: Secondary | ICD-10-CM | POA: Diagnosis not present

## 2018-12-27 DIAGNOSIS — O10011 Pre-existing essential hypertension complicating pregnancy, first trimester: Secondary | ICD-10-CM | POA: Diagnosis not present

## 2018-12-27 DIAGNOSIS — Z3A13 13 weeks gestation of pregnancy: Secondary | ICD-10-CM | POA: Diagnosis not present

## 2019-01-17 DIAGNOSIS — Z3A16 16 weeks gestation of pregnancy: Secondary | ICD-10-CM | POA: Diagnosis not present

## 2019-01-17 DIAGNOSIS — O10012 Pre-existing essential hypertension complicating pregnancy, second trimester: Secondary | ICD-10-CM | POA: Diagnosis not present

## 2019-02-06 DIAGNOSIS — O10012 Pre-existing essential hypertension complicating pregnancy, second trimester: Secondary | ICD-10-CM | POA: Diagnosis not present

## 2019-02-06 DIAGNOSIS — Z363 Encounter for antenatal screening for malformations: Secondary | ICD-10-CM | POA: Diagnosis not present

## 2019-02-06 DIAGNOSIS — Z3A19 19 weeks gestation of pregnancy: Secondary | ICD-10-CM | POA: Diagnosis not present

## 2019-03-07 DIAGNOSIS — Z3A23 23 weeks gestation of pregnancy: Secondary | ICD-10-CM | POA: Diagnosis not present

## 2019-03-07 DIAGNOSIS — O10012 Pre-existing essential hypertension complicating pregnancy, second trimester: Secondary | ICD-10-CM | POA: Diagnosis not present

## 2019-03-07 DIAGNOSIS — O365920 Maternal care for other known or suspected poor fetal growth, second trimester, not applicable or unspecified: Secondary | ICD-10-CM | POA: Diagnosis not present

## 2019-04-04 DIAGNOSIS — O10012 Pre-existing essential hypertension complicating pregnancy, second trimester: Secondary | ICD-10-CM | POA: Diagnosis not present

## 2019-04-04 DIAGNOSIS — Z3689 Encounter for other specified antenatal screening: Secondary | ICD-10-CM | POA: Diagnosis not present

## 2019-04-04 DIAGNOSIS — Z3A27 27 weeks gestation of pregnancy: Secondary | ICD-10-CM | POA: Diagnosis not present

## 2019-04-04 DIAGNOSIS — O10019 Pre-existing essential hypertension complicating pregnancy, unspecified trimester: Secondary | ICD-10-CM | POA: Diagnosis not present

## 2019-04-04 DIAGNOSIS — O3663X0 Maternal care for excessive fetal growth, third trimester, not applicable or unspecified: Secondary | ICD-10-CM | POA: Diagnosis not present

## 2019-04-17 DIAGNOSIS — O10013 Pre-existing essential hypertension complicating pregnancy, third trimester: Secondary | ICD-10-CM | POA: Diagnosis not present

## 2019-04-17 DIAGNOSIS — Z3A29 29 weeks gestation of pregnancy: Secondary | ICD-10-CM | POA: Diagnosis not present

## 2019-04-17 DIAGNOSIS — Z3689 Encounter for other specified antenatal screening: Secondary | ICD-10-CM | POA: Diagnosis not present

## 2019-05-05 DIAGNOSIS — Z3A32 32 weeks gestation of pregnancy: Secondary | ICD-10-CM | POA: Diagnosis not present

## 2019-05-05 DIAGNOSIS — O10013 Pre-existing essential hypertension complicating pregnancy, third trimester: Secondary | ICD-10-CM | POA: Diagnosis not present

## 2019-05-08 DIAGNOSIS — O10013 Pre-existing essential hypertension complicating pregnancy, third trimester: Secondary | ICD-10-CM | POA: Diagnosis not present

## 2019-05-08 DIAGNOSIS — Z3A32 32 weeks gestation of pregnancy: Secondary | ICD-10-CM | POA: Diagnosis not present

## 2019-05-08 DIAGNOSIS — O10019 Pre-existing essential hypertension complicating pregnancy, unspecified trimester: Secondary | ICD-10-CM | POA: Diagnosis not present

## 2019-05-12 DIAGNOSIS — Z3A33 33 weeks gestation of pregnancy: Secondary | ICD-10-CM | POA: Diagnosis not present

## 2019-05-12 DIAGNOSIS — O10013 Pre-existing essential hypertension complicating pregnancy, third trimester: Secondary | ICD-10-CM | POA: Diagnosis not present

## 2019-05-15 DIAGNOSIS — O10013 Pre-existing essential hypertension complicating pregnancy, third trimester: Secondary | ICD-10-CM | POA: Diagnosis not present

## 2019-05-15 DIAGNOSIS — Z3A33 33 weeks gestation of pregnancy: Secondary | ICD-10-CM | POA: Diagnosis not present

## 2019-05-15 DIAGNOSIS — O10019 Pre-existing essential hypertension complicating pregnancy, unspecified trimester: Secondary | ICD-10-CM | POA: Diagnosis not present

## 2019-05-17 DIAGNOSIS — O10019 Pre-existing essential hypertension complicating pregnancy, unspecified trimester: Secondary | ICD-10-CM | POA: Diagnosis not present

## 2019-05-17 DIAGNOSIS — Z3A33 33 weeks gestation of pregnancy: Secondary | ICD-10-CM | POA: Diagnosis not present

## 2019-05-18 DIAGNOSIS — O10013 Pre-existing essential hypertension complicating pregnancy, third trimester: Secondary | ICD-10-CM | POA: Diagnosis not present

## 2019-05-18 DIAGNOSIS — Z3A34 34 weeks gestation of pregnancy: Secondary | ICD-10-CM | POA: Diagnosis not present

## 2019-05-25 DIAGNOSIS — O10013 Pre-existing essential hypertension complicating pregnancy, third trimester: Secondary | ICD-10-CM | POA: Diagnosis not present

## 2019-05-25 DIAGNOSIS — Z3685 Encounter for antenatal screening for Streptococcus B: Secondary | ICD-10-CM | POA: Diagnosis not present

## 2019-05-25 DIAGNOSIS — Z3A35 35 weeks gestation of pregnancy: Secondary | ICD-10-CM | POA: Diagnosis not present

## 2019-05-25 LAB — OB RESULTS CONSOLE GBS: GBS: POSITIVE

## 2019-06-02 DIAGNOSIS — Z3A36 36 weeks gestation of pregnancy: Secondary | ICD-10-CM | POA: Diagnosis not present

## 2019-06-02 DIAGNOSIS — O113 Pre-existing hypertension with pre-eclampsia, third trimester: Secondary | ICD-10-CM | POA: Diagnosis not present

## 2019-06-06 DIAGNOSIS — O113 Pre-existing hypertension with pre-eclampsia, third trimester: Secondary | ICD-10-CM | POA: Diagnosis not present

## 2019-06-06 DIAGNOSIS — Z3A36 36 weeks gestation of pregnancy: Secondary | ICD-10-CM | POA: Diagnosis not present

## 2019-06-07 ENCOUNTER — Other Ambulatory Visit: Payer: Self-pay | Admitting: Obstetrics and Gynecology

## 2019-06-08 ENCOUNTER — Encounter (HOSPITAL_COMMUNITY): Payer: Self-pay | Admitting: Emergency Medicine

## 2019-06-08 ENCOUNTER — Inpatient Hospital Stay (HOSPITAL_COMMUNITY): Payer: BC Managed Care – PPO

## 2019-06-08 ENCOUNTER — Inpatient Hospital Stay (HOSPITAL_COMMUNITY)
Admission: AD | Admit: 2019-06-08 | Discharge: 2019-06-12 | DRG: 807 | Disposition: A | Discharge status: Home / Self Care | Payer: BC Managed Care – PPO | Attending: Obstetrics and Gynecology | Admitting: Obstetrics and Gynecology

## 2019-06-08 ENCOUNTER — Other Ambulatory Visit: Payer: Self-pay

## 2019-06-08 DIAGNOSIS — Z1159 Encounter for screening for other viral diseases: Secondary | ICD-10-CM

## 2019-06-08 DIAGNOSIS — Z3490 Encounter for supervision of normal pregnancy, unspecified, unspecified trimester: Secondary | ICD-10-CM | POA: Diagnosis present

## 2019-06-08 DIAGNOSIS — O113 Pre-existing hypertension with pre-eclampsia, third trimester: Secondary | ICD-10-CM | POA: Diagnosis not present

## 2019-06-08 DIAGNOSIS — K429 Umbilical hernia without obstruction or gangrene: Secondary | ICD-10-CM | POA: Diagnosis not present

## 2019-06-08 DIAGNOSIS — O99824 Streptococcus B carrier state complicating childbirth: Secondary | ICD-10-CM | POA: Diagnosis not present

## 2019-06-08 DIAGNOSIS — Z3A37 37 weeks gestation of pregnancy: Secondary | ICD-10-CM | POA: Diagnosis not present

## 2019-06-08 DIAGNOSIS — O114 Pre-existing hypertension with pre-eclampsia, complicating childbirth: Secondary | ICD-10-CM | POA: Diagnosis not present

## 2019-06-08 DIAGNOSIS — R011 Cardiac murmur, unspecified: Secondary | ICD-10-CM | POA: Diagnosis not present

## 2019-06-08 DIAGNOSIS — D649 Anemia, unspecified: Secondary | ICD-10-CM | POA: Diagnosis not present

## 2019-06-08 DIAGNOSIS — O9902 Anemia complicating childbirth: Secondary | ICD-10-CM | POA: Diagnosis not present

## 2019-06-08 DIAGNOSIS — Z87891 Personal history of nicotine dependence: Secondary | ICD-10-CM

## 2019-06-08 DIAGNOSIS — O1002 Pre-existing essential hypertension complicating childbirth: Secondary | ICD-10-CM | POA: Diagnosis not present

## 2019-06-08 DIAGNOSIS — O164 Unspecified maternal hypertension, complicating childbirth: Secondary | ICD-10-CM | POA: Diagnosis not present

## 2019-06-08 DIAGNOSIS — O119 Pre-existing hypertension with pre-eclampsia, unspecified trimester: Secondary | ICD-10-CM | POA: Diagnosis present

## 2019-06-08 DIAGNOSIS — Z23 Encounter for immunization: Secondary | ICD-10-CM | POA: Diagnosis not present

## 2019-06-08 LAB — PROTEIN / CREATININE RATIO, URINE
Creatinine, Urine: 63.2 mg/dL
Creatinine, Urine: 29.33 mg/dL
Total Protein, Urine: <6 mg/dL
Total Protein, Urine: <6 mg/dL

## 2019-06-08 LAB — CBC
HCT: 35.2 % — ABNORMAL LOW (ref 36.0–46.0)
HCT: 38.7 % (ref 36.0–46.0)
Hemoglobin: 11.8 g/dL — ABNORMAL LOW (ref 12.0–15.0)
Hemoglobin: 13 g/dL (ref 12.0–15.0)
MCH: 27.3 pg (ref 26.0–34.0)
MCH: 27.3 pg (ref 26.0–34.0)
MCHC: 33.5 g/dL (ref 30.0–36.0)
MCHC: 33.6 g/dL (ref 30.0–36.0)
MCV: 81.5 fL (ref 80.0–100.0)
MCV: 81.3 fL (ref 80.0–100.0)
Platelets: 201 10*3/uL (ref 150–400)
Platelets: 203 10*3/uL (ref 150–400)
RBC: 4.32 MIL/uL (ref 3.87–5.11)
RBC: 4.76 MIL/uL (ref 3.87–5.11)
RDW: 13.7 % (ref 11.5–15.5)
RDW: 13.5 % (ref 11.5–15.5)
WBC: 9.5 10*3/uL (ref 4.0–10.5)
WBC: 11.6 10*3/uL — ABNORMAL HIGH (ref 4.0–10.5)
nRBC: 0 % (ref 0.0–0.2)
nRBC: 0 % (ref 0.0–0.2)

## 2019-06-08 LAB — COMPREHENSIVE METABOLIC PANEL
ALT: 14 U/L (ref 0–44)
AST: 22 U/L (ref 15–41)
Albumin: 3 g/dL — ABNORMAL LOW (ref 3.5–5.0)
Alkaline Phosphatase: 74 U/L (ref 38–126)
Anion gap: 9 (ref 5–15)
BUN: 7 mg/dL (ref 6–20)
CO2: 19 mmol/L — ABNORMAL LOW (ref 22–32)
Calcium: 9.3 mg/dL (ref 8.9–10.3)
Chloride: 108 mmol/L (ref 98–111)
Creatinine, Ser: 0.64 mg/dL (ref 0.44–1.00)
GFR calc Af Amer: >60 mL/min (ref >60)
GFR calc non Af Amer: >60 mL/min (ref >60)
Glucose, Bld: 85 mg/dL (ref 70–99)
Potassium: 3.6 mmol/L (ref 3.5–5.1)
Sodium: 136 mmol/L (ref 135–145)
Total Bilirubin: 0.3 mg/dL (ref 0.3–1.2)
Total Protein: 6.4 g/dL — ABNORMAL LOW (ref 6.5–8.1)

## 2019-06-08 LAB — TYPE AND SCREEN
ABO/RH(D): O POS
Antibody Screen: NEGATIVE

## 2019-06-08 LAB — RPR: RPR Ser Ql: NONREACTIVE

## 2019-06-08 LAB — ABO/RH: ABO/RH(D): O POS

## 2019-06-08 LAB — SARS CORONAVIRUS 2 BY RT PCR (HOSPITAL ORDER, PERFORMED IN ~~LOC~~ HOSPITAL LAB): SARS Coronavirus 2: NEGATIVE

## 2019-06-08 MED ORDER — OXYTOCIN 40 UNITS IN NORMAL SALINE INFUSION - SIMPLE MED
2.5000 [IU]/h | INTRAVENOUS | Status: DC
  Administered 2019-06-10: 2.5 [IU]/h via INTRAVENOUS
  Filled 2019-06-08 (×2): qty 1000

## 2019-06-08 MED ORDER — PENICILLIN G 3 MILLION UNITS IVPB - SIMPLE MED
3.0000 10*6.[IU] | INTRAVENOUS | Status: DC
  Administered 2019-06-08 – 2019-06-10 (×11): 3 10*6.[IU] via INTRAVENOUS
  Filled 2019-06-08 (×14): qty 100

## 2019-06-08 MED ORDER — ONDANSETRON HCL 4 MG/2ML IJ SOLN: 4.0000 mg | Freq: Four times a day (QID) | INTRAMUSCULAR | Status: DC | PRN

## 2019-06-08 MED ORDER — ACETAMINOPHEN 325 MG PO TABS: 650.0000 mg | ORAL_TABLET | ORAL | Status: DC | PRN

## 2019-06-08 MED ORDER — MISOPROSTOL 50MCG HALF TABLET
50.0000 ug | ORAL_TABLET | ORAL | Status: DC | PRN
  Administered 2019-06-08: 50 ug via ORAL
  Filled 2019-06-08 (×2): qty 1

## 2019-06-08 MED ORDER — NIFEDIPINE ER OSMOTIC RELEASE 30 MG PO TB24
30.0000 mg | ORAL_TABLET | Freq: Every day | ORAL | Status: DC
  Administered 2019-06-08 – 2019-06-12 (×5): 30 mg via ORAL
  Filled 2019-06-08 (×6): qty 1

## 2019-06-08 MED ORDER — SOD CITRATE-CITRIC ACID 500-334 MG/5ML PO SOLN: 30.0000 mL | ORAL | Status: DC | PRN

## 2019-06-08 MED ORDER — LACTATED RINGERS IV SOLN
500.0000 mL | INTRAVENOUS | Status: DC | PRN
  Administered 2019-06-10: 500 mL via INTRAVENOUS
  Administered 2019-06-10 (×2): 250 mL via INTRAVENOUS

## 2019-06-08 MED ORDER — TERBUTALINE SULFATE 1 MG/ML IJ SOLN: 0.2500 mg | Freq: Once | INTRAMUSCULAR | Status: DC | PRN

## 2019-06-08 MED ORDER — OXYTOCIN 40 UNITS IN NORMAL SALINE INFUSION - SIMPLE MED
1.0000 m[IU]/min | INTRAVENOUS | Status: DC
  Administered 2019-06-08: 2 m[IU]/min via INTRAVENOUS

## 2019-06-08 MED ORDER — FENTANYL CITRATE (PF) 100 MCG/2ML IJ SOLN
INTRAMUSCULAR | Status: AC
  Filled 2019-06-08: qty 2

## 2019-06-08 MED ORDER — LIDOCAINE HCL (PF) 1 % IJ SOLN: 30.0000 mL | INTRAMUSCULAR | Status: DC | PRN

## 2019-06-08 MED ORDER — SODIUM CHLORIDE 0.9 % IV SOLN
5.0000 10*6.[IU] | Freq: Once | INTRAVENOUS | Status: AC
  Administered 2019-06-08: 5 10*6.[IU] via INTRAVENOUS
  Filled 2019-06-08: qty 5

## 2019-06-08 MED ORDER — LACTATED RINGERS IV SOLN
INTRAVENOUS | Status: DC
  Administered 2019-06-08 – 2019-06-10 (×5): via INTRAVENOUS

## 2019-06-08 MED ORDER — OXYTOCIN BOLUS FROM INFUSION
500.0000 mL | Freq: Once | INTRAVENOUS | Status: AC
  Administered 2019-06-10: 500 mL via INTRAVENOUS

## 2019-06-08 MED ORDER — FENTANYL CITRATE (PF) 100 MCG/2ML IJ SOLN
100.0000 ug | INTRAMUSCULAR | Status: DC | PRN
  Administered 2019-06-08 (×4): 100 ug via INTRAVENOUS
  Filled 2019-06-08 (×3): qty 2

## 2019-06-08 NOTE — H&P (Addendum)
Cheryl Blair is Blair 23 y.o. female presenting@ 37 wk for IOL 2nd to chronic HTN with superimposed preeclampsia. 24 hr UTP 415 mg( 7/2). Pt has been on procardia. Nl antepartum testing. Denies h/Blair, visual changes or epigastric pain OB History    Gravida  1   Para  0   Term  0   Preterm  0   AB  0   Living  0     SAB  0   TAB  0   Ectopic  0   Multiple  0   Live Births  0          Past Medical History:  Diagnosis Date  . Chlamydia   . Hypertension    Past Surgical History:  Procedure Laterality Date  . FOOT SURGERY     Family History: family history includes Diabetes in her mother; Heart failure in her mother; Hypertension in her father. Social History:  reports that she has quit smoking. Her smoking use included cigarettes. She has never used smokeless tobacco. She reports previous alcohol use. She reports that she does not use drugs.     Maternal Diabetes: No Genetic Screening: Declined Maternal Ultrasounds/Referrals: Normal Fetal Ultrasounds or other Referrals:  None Maternal Substance Abuse:  No Significant Maternal Medications:  Meds include: Other: procardia Significant Maternal Lab Results:  Group B Strep positive Other Comments:  chronic HTN, BMZ complete  Review of Systems  Eyes: Negative for blurred vision and double vision.  Cardiovascular: Positive for leg swelling.  Gastrointestinal: Negative for heartburn and nausea.  Neurological: Negative for headaches.   Maternal Medical History:  Reason for admission: Nausea.    Dilation: Fingertip Effacement (%): Thick Station: Ballotable Exam by:: Cheryl Blair CNM Blood pressure 119/75, pulse 87, temperature 98.1 F (36.7 C), temperature source Oral, resp. rate 18, height 5\' 11"  (1.803 m), weight 108.9 kg, last menstrual period 09/22/2018. Exam Physical Exam  Constitutional: She is oriented to person, place, and time. She appears well-nourished.  HENT:  Head: Atraumatic.  Eyes: EOM are  normal.  Neck: Neck supple.  Cardiovascular: Regular rhythm.  Respiratory: Breath sounds normal.  GI: Soft.  Musculoskeletal:        General: Edema present.  Neurological: She is alert and oriented to person, place, and time. She has normal reflexes.  Skin: Skin is warm and dry.  Psychiatric: She has Blair normal mood and affect.    Prenatal labs: ABO, Rh: --/--/O POS, O POS (07/23 8185) Antibody: NEG (07/23 6314) Rubella: Immune (01/13 0000) RPR: Nonreactive (01/13 0000)  HBsAg: Negative (01/13 0000)  HIV: Non-reactive (01/13 0000)  GBS: Positive (07/09 0000)   Assessment/Plan: Chronic HTN with superimposed preeclampsia IUP@ 37 wk  GBS cx (+)  P) admit. Cheryl Blair labs. Oral cytotec. Intracervical balloon. IV PCN. magnesium in active labor   Cheryl Blair Cheryl Blair 06/08/2019, 1:12 PM

## 2019-06-08 NOTE — Progress Notes (Signed)
S/p cytotec x 1 Feels some mild cramping  O: VS  BP 132/84 P95 VE: ft/60/ballotable Intracervical balloon placed  Tracing: baseline 145 (+) accel (+) ctx  CBC Latest Ref Rng & Units 06/08/2019 06/08/2019  WBC 4.0 - 10.5 K/uL 11.6(H) 9.5  Hemoglobin 12.0 - 15.0 g/dL 13.0 11.8(L)  Hematocrit 36.0 - 46.0 % 38.7 35.2(L)  Platelets 150 - 400 K/uL 203 201   CMP Latest Ref Rng & Units 06/08/2019  Glucose 70 - 99 mg/dL 85  BUN 6 - 20 mg/dL 7  Creatinine 0.44 - 1.00 mg/dL 0.64  Sodium 135 - 145 mmol/L 136  Potassium 3.5 - 5.1 mmol/L 3.6  Chloride 98 - 111 mmol/L 108  CO2 22 - 32 mmol/L 19(L)  Calcium 8.9 - 10.3 mg/dL 9.3  Total Protein 6.5 - 8.1 g/dL 6.4(L)  Total Bilirubin 0.3 - 1.2 mg/dL 0.3  Alkaline Phos 38 - 126 U/L 74  AST 15 - 41 U/L 22  ALT 0 - 44 U/L 14  IMP: chronic HTN with superimposed preeclampsia IUP@ 37 wk GBS cx (+) on IV PCN P) start pitocin

## 2019-06-08 NOTE — Progress Notes (Signed)
Pt requested information on advance directives upon admission.  I brought her some information and let her know that if she wants to fill that out while she is here, we can assist her. She does not wish to do so at this time.  [place], Lorenzo Pager, [phone]

## 2019-06-08 NOTE — Progress Notes (Signed)
I was asked by Dr. Garwin Brothers to place intracervical balloon.  S:  Comfortable.   O:  Cytotec 51mcg buccally at 8am VS: Temperature 98.1 F (36.7 C), temperature source Oral, height 5\' 11"  (1.803 m), weight 108.9 kg, last menstrual period 09/22/2018.        FHR : baseline 150 bpm / variability moderate/ accelerations +15x15 / no decelerations        Toco: contractions  Are rare        Cervix : closed/thick/high - attempted to place with extra long Graves speculum and unable to obtain good visualization of cervix. On bimanual, cervix high and closed. Pt. Uncomfortable with exams, but tolerated well.        Membranes: intact  A: Induction of labor at 37 weeks for PEC     FHR category 1     GBS positive - receiving PCN prophylaxis   P: Attempted to place intracervical balloon with speculum, and unable to place.  Will reassess in 1 hour for placement. Pt. In agreement, and Dr. Garwin Brothers updated.     Lars Pinks, MSN, CNM [facility]

## 2019-06-09 ENCOUNTER — Inpatient Hospital Stay (HOSPITAL_COMMUNITY): Payer: BC Managed Care – PPO | Admitting: Anesthesiology

## 2019-06-09 LAB — PLATELET COUNT: Platelets: 204 10*3/uL (ref 150–400)

## 2019-06-09 LAB — MAGNESIUM: Magnesium: 3.9 mg/dL — ABNORMAL HIGH (ref 1.7–2.4)

## 2019-06-09 MED ORDER — EPHEDRINE 5 MG/ML INJ
10.0000 mg | INTRAVENOUS | Status: DC | PRN
  Filled 2019-06-09: qty 2

## 2019-06-09 MED ORDER — PHENYLEPHRINE 40 MCG/ML (10ML) SYRINGE FOR IV PUSH (FOR BLOOD PRESSURE SUPPORT)
80.0000 ug | PREFILLED_SYRINGE | INTRAVENOUS | Status: DC | PRN
  Filled 2019-06-09: qty 10

## 2019-06-09 MED ORDER — LIDOCAINE-EPINEPHRINE (PF) 2 %-1:200000 IJ SOLN
INTRAMUSCULAR | Status: DC | PRN
  Administered 2019-06-09 (×2): 3 mL via EPIDURAL

## 2019-06-09 MED ORDER — FENTANYL-BUPIVACAINE-NACL 0.5-0.125-0.9 MG/250ML-% EP SOLN
12.0000 mL/h | EPIDURAL | Status: DC | PRN
  Administered 2019-06-10: 12 mL/h via EPIDURAL
  Filled 2019-06-09 (×2): qty 250

## 2019-06-09 MED ORDER — TERBUTALINE SULFATE 1 MG/ML IJ SOLN: 0.2500 mg | Freq: Once | INTRAMUSCULAR | Status: DC | PRN

## 2019-06-09 MED ORDER — MAGNESIUM SULFATE BOLUS VIA INFUSION
4.0000 g | Freq: Once | INTRAVENOUS | Status: AC
  Administered 2019-06-09: 4 g via INTRAVENOUS
  Filled 2019-06-09: qty 500

## 2019-06-09 MED ORDER — LACTATED RINGERS IV SOLN
500.0000 mL | Freq: Once | INTRAVENOUS | Status: AC
  Administered 2019-06-09: 500 mL via INTRAVENOUS

## 2019-06-09 MED ORDER — MAGNESIUM SULFATE 2 GM/50ML IV SOLN: 2.0000 g | Freq: Once | INTRAVENOUS | Status: DC

## 2019-06-09 MED ORDER — SODIUM CHLORIDE (PF) 0.9 % IJ SOLN
INTRAMUSCULAR | Status: DC | PRN
  Administered 2019-06-09: 12 mL/h via EPIDURAL

## 2019-06-09 MED ORDER — MAGNESIUM SULFATE 40 G IN LACTATED RINGERS - SIMPLE
2.0000 g/h | INTRAVENOUS | Status: DC
  Administered 2019-06-10: 2 g/h via INTRAVENOUS
  Filled 2019-06-09 (×2): qty 500

## 2019-06-09 MED ORDER — MAGNESIUM SULFATE BOLUS VIA INFUSION
2.0000 g | Freq: Once | INTRAVENOUS | Status: AC
  Administered 2019-06-09: 2 g via INTRAVENOUS

## 2019-06-09 MED ORDER — DIPHENHYDRAMINE HCL 50 MG/ML IJ SOLN: 12.5000 mg | INTRAMUSCULAR | Status: DC | PRN

## 2019-06-09 MED ORDER — OXYTOCIN 40 UNITS IN NORMAL SALINE INFUSION - SIMPLE MED
1.0000 m[IU]/min | INTRAVENOUS | Status: DC
  Administered 2019-06-10: 20 m[IU]/min via INTRAVENOUS
  Filled 2019-06-09: qty 1000

## 2019-06-09 MED ORDER — MISOPROSTOL 25 MCG QUARTER TABLET
25.0000 ug | ORAL_TABLET | ORAL | Status: DC
  Administered 2019-06-09: 25 ug via VAGINAL
  Filled 2019-06-09 (×4): qty 1

## 2019-06-09 NOTE — Anesthesia Procedure Notes (Signed)
Epidural Patient location during procedure: OB Start time: 06/09/2019 1:50 PM End time: 06/09/2019 2:05 PM  Staffing Anesthesiologist: Freddrick March, MD Performed: anesthesiologist   Preanesthetic Checklist Completed: patient identified, pre-op evaluation, timeout performed, IV checked, risks and benefits discussed and monitors and equipment checked  Epidural Patient position: sitting Prep: site prepped and draped and DuraPrep Patient monitoring: continuous pulse ox, blood pressure, heart rate and cardiac monitor Approach: midline Location: L3-L4 Injection technique: LOR air  Needle:  Needle type: Tuohy  Needle gauge: 17 G Needle length: 9 cm Needle insertion depth: 8 cm Catheter type: closed end flexible Catheter size: 19 Gauge Catheter at skin depth: 15 cm Test dose: negative  Assessment Sensory level: T8 Events: blood not aspirated, injection not painful, no injection resistance, negative IV test and no paresthesia  Additional Notes Patient identified. Risks/Benefits/Options discussed with patient including but not limited to bleeding, infection, nerve damage, paralysis, failed block, incomplete pain control, headache, blood pressure changes, nausea, vomiting, reactions to medication both or allergic, itching and postpartum back pain. Confirmed with bedside nurse the patient's most recent platelet count. Confirmed with patient that they are not currently taking any anticoagulation, have any bleeding history or any family history of bleeding disorders. Patient expressed understanding and wished to proceed. All questions were answered. Sterile technique was used throughout the entire procedure. Please see nursing notes for vital signs. Test dose was given through epidural catheter and negative prior to continuing to dose epidural or start infusion. Warning signs of high block given to the patient including shortness of breath, tingling/numbness in hands, complete motor block,  or any concerning symptoms with instructions to call for help. Patient was given instructions on fall risk and not to get out of bed. All questions and concerns addressed with instructions to call with any issues or inadequate analgesia.  Reason for block:procedure for pain

## 2019-06-09 NOTE — Progress Notes (Signed)
S; notes cramping  O:  Patient Vitals for the past 24 hrs:  BP Temp Temp src Pulse Resp Height Weight  06/09/19 0415 - 99.6 F (37.6 C) Axillary - - - -  06/09/19 0247 (!) 148/74 98.9 F (37.2 C) Oral 93 18 - -  06/09/19 0205 140/64 - - 93 - - -  06/09/19 0101 (!) 151/68 - - 96 16 - -  06/09/19 0004 (!) 148/81 - - (!) 104 - - -  06/09/19 0000 - 98.1 F (36.7 C) Oral - 18 - -  06/08/19 2330 (!) 144/71 - - (!) 101 20 - -  06/08/19 2302 (!) 150/77 - - (!) 103 - - -  06/08/19 1957 135/88 98.9 F (37.2 C) Axillary 95 19 - -  06/08/19 1810 138/62 - - 99 - - -  06/08/19 1734 (!) 142/69 98.7 F (37.1 C) Oral 93 18 - -  06/08/19 1641 126/85 - - 85 18 - -  06/08/19 1600 137/68 - - 91 - - -  06/08/19 1447 (!) 129/53 98 F (36.7 C) Oral 87 - - -  06/08/19 1348 135/78 - - 90 - - -  06/08/19 1258 132/84 - - 95 - - -  06/08/19 1144 119/75 - - 87 - - -  06/08/19 1040 135/72 - - 98 18 - -  06/08/19 0940 135/64 - - 88 - - -  06/08/19 0758 - 98.1 F (36.7 C) Oral - - 5\' 11"  (1.803 m) 108.9 kg  06/08/19 0700 - - - - - 5\' 11"  (1.803 m) 108.9 kg  pitocin 20 mius Intracervical balloon out VE 4/60/-3 applied  Tracing baseline 150 (+) accel to 170 Ctx q 1-74mins  IMP: latent phase labor Chronic htn with superimposed preeclampsia on procardia GBS cx (+) on IV PCN IUP @ 37 1/7 P) stop pitocin. Resume cervical ripening x 2 dose/ magnesium in active labor. Defer amniotomy

## 2019-06-09 NOTE — Progress Notes (Signed)
S: comfortable  O: BP 132/62   Pulse 86   Temp 99.6 F (37.6 C) (Axillary)   Resp 18   Ht 5\' 11"  (1.803 m)   Wt 108.9 kg   LMP 09/22/2018   BMI 33.48 kg/m   pitocin VE 4/70/-3 AROM clear fluid IUPC/ISE placed  Tracing: Baseline 140 (+) accel 160 ? Ctx  IMP: Latent phase GBS cx (+) on IV PCN Chronic HTn with superimposed preeclampsia On procardia P) start magnesium. Cont pitocin and IV PCN. Mag level 4 hr after loading

## 2019-06-09 NOTE — Progress Notes (Signed)
S; no complaint  O: pitocin 14 miu VS BP 111/50 VE deferred Tracing baseline 150 (+) accels  ctx q 1-3 mins  Mag 3.9  IMP: chronic HTn with superimposed preeclampsia On magneisum Latent phase labor due to sub optimal labor GBS cx (+)  On IV PCN P) magnesium bolus ( 2g). Cont increasing pitocin. Exaggerated sims positions.

## 2019-06-09 NOTE — Anesthesia Preprocedure Evaluation (Addendum)
Anesthesia Evaluation  Patient identified by MRN, date of birth, ID band Patient awake    Reviewed: Allergy & Precautions, NPO status , Patient's Chart, lab work & pertinent test results  Airway Mallampati: II  TM Distance: >3 FB Neck ROM: Full    Dental no notable dental hx.    Pulmonary neg pulmonary ROS, former smoker,    Pulmonary exam normal breath sounds clear to auscultation       Cardiovascular hypertension, Pt. on medications negative cardio ROS Normal cardiovascular exam Rhythm:Regular Rate:Normal     Neuro/Psych negative neurological ROS  negative psych ROS   GI/Hepatic negative GI ROS, Neg liver ROS,   Endo/Other  negative endocrine ROS  Renal/GU negative Renal ROS  negative genitourinary   Musculoskeletal negative musculoskeletal ROS (+)   Abdominal   Peds  Hematology negative hematology ROS (+)   Anesthesia Other Findings Chronic HTN with superimposed preE on mag  Reproductive/Obstetrics (+) Pregnancy                            Anesthesia Physical Anesthesia Plan  ASA: III  Anesthesia Plan: Epidural   Post-op Pain Management:    Induction:   PONV Risk Score and Plan: Treatment may vary due to age or medical condition  Airway Management Planned: Natural Airway  Additional Equipment:   Intra-op Plan:   Post-operative Plan:   Informed Consent: I have reviewed the patients History and Physical, chart, labs and discussed the procedure including the risks, benefits and alternatives for the proposed anesthesia with the patient or authorized representative who has indicated his/her understanding and acceptance.     Dental advisory given  Plan Discussed with: CRNA  Anesthesia Plan Comments: (Patient identified. Risks, benefits, options discussed with patient including but not limited to bleeding, infection, nerve damage, paralysis, failed block, incomplete pain  control, headache, blood pressure changes, nausea, vomiting, reactions to medication, itching, and post partum back pain. Confirmed with bedside nurse the patient's most recent platelet count. Confirmed with the patient that they are not taking any anticoagulation, have any bleeding history or any family history of bleeding disorders. Patient expressed understanding and wishes to proceed. All questions were answered. )       Anesthesia Quick Evaluation

## 2019-06-10 ENCOUNTER — Encounter (HOSPITAL_COMMUNITY): Payer: Self-pay

## 2019-06-10 LAB — CBC
HCT: 33.7 % — ABNORMAL LOW (ref 36.0–46.0)
Hemoglobin: 11.6 g/dL — ABNORMAL LOW (ref 12.0–15.0)
MCH: 27.8 pg (ref 26.0–34.0)
MCHC: 34.4 g/dL (ref 30.0–36.0)
MCV: 80.6 fL (ref 80.0–100.0)
Platelets: 193 10*3/uL (ref 150–400)
RBC: 4.18 MIL/uL (ref 3.87–5.11)
RDW: 13.6 % (ref 11.5–15.5)
WBC: 16.8 10*3/uL — ABNORMAL HIGH (ref 4.0–10.5)
nRBC: 0 % (ref 0.0–0.2)

## 2019-06-10 MED ORDER — DIPHENHYDRAMINE HCL 25 MG PO CAPS: 25.0000 mg | ORAL_CAPSULE | Freq: Four times a day (QID) | ORAL | Status: DC | PRN

## 2019-06-10 MED ORDER — SENNOSIDES-DOCUSATE SODIUM 8.6-50 MG PO TABS
2.0000 | ORAL_TABLET | ORAL | Status: DC
  Administered 2019-06-10 – 2019-06-11 (×2): 2 via ORAL
  Filled 2019-06-10 (×2): qty 2

## 2019-06-10 MED ORDER — ACETAMINOPHEN 325 MG PO TABS: 650.0000 mg | ORAL_TABLET | ORAL | Status: DC | PRN

## 2019-06-10 MED ORDER — MAGNESIUM SULFATE 40 G IN LACTATED RINGERS - SIMPLE
2.0000 g/h | INTRAVENOUS | Status: DC
  Administered 2019-06-10: 2 g/h via INTRAVENOUS
  Filled 2019-06-10: qty 500

## 2019-06-10 MED ORDER — ONDANSETRON HCL 4 MG/2ML IJ SOLN: 4.0000 mg | INTRAMUSCULAR | Status: DC | PRN

## 2019-06-10 MED ORDER — OXYCODONE HCL 5 MG PO TABS: 5.0000 mg | ORAL_TABLET | ORAL | Status: DC | PRN

## 2019-06-10 MED ORDER — OXYCODONE HCL 5 MG PO TABS
10.0000 mg | ORAL_TABLET | ORAL | Status: DC | PRN
  Administered 2019-06-11 (×2): 10 mg via ORAL
  Filled 2019-06-10 (×2): qty 2

## 2019-06-10 MED ORDER — SIMETHICONE 80 MG PO CHEW
80.0000 mg | CHEWABLE_TABLET | ORAL | Status: DC | PRN
  Administered 2019-06-11: 80 mg via ORAL

## 2019-06-10 MED ORDER — LACTATED RINGERS IV SOLN
INTRAVENOUS | Status: DC
  Administered 2019-06-10 – 2019-06-11 (×2): via INTRAVENOUS

## 2019-06-10 MED ORDER — WITCH HAZEL-GLYCERIN EX PADS: 1.0000 "application " | MEDICATED_PAD | CUTANEOUS | Status: DC | PRN

## 2019-06-10 MED ORDER — PRENATAL MULTIVITAMIN CH
1.0000 | ORAL_TABLET | Freq: Every day | ORAL | Status: DC
  Administered 2019-06-10 – 2019-06-12 (×3): 1 via ORAL
  Filled 2019-06-10 (×3): qty 1

## 2019-06-10 MED ORDER — FERROUS SULFATE 325 (65 FE) MG PO TABS
325.0000 mg | ORAL_TABLET | Freq: Two times a day (BID) | ORAL | Status: DC
  Administered 2019-06-10 – 2019-06-11 (×3): 325 mg via ORAL
  Filled 2019-06-10 (×3): qty 1

## 2019-06-10 MED ORDER — IBUPROFEN 600 MG PO TABS
600.0000 mg | ORAL_TABLET | Freq: Four times a day (QID) | ORAL | Status: DC
  Administered 2019-06-10 – 2019-06-12 (×9): 600 mg via ORAL
  Filled 2019-06-10 (×9): qty 1

## 2019-06-10 MED ORDER — COCONUT OIL OIL
1.0000 "application " | TOPICAL_OIL | Status: DC | PRN
  Administered 2019-06-10: 1 via TOPICAL

## 2019-06-10 MED ORDER — DIBUCAINE (PERIANAL) 1 % EX OINT: 1.0000 "application " | TOPICAL_OINTMENT | CUTANEOUS | Status: DC | PRN

## 2019-06-10 MED ORDER — ONDANSETRON HCL 4 MG PO TABS: 4.0000 mg | ORAL_TABLET | ORAL | Status: DC | PRN

## 2019-06-10 MED ORDER — ZOLPIDEM TARTRATE 5 MG PO TABS: 5.0000 mg | ORAL_TABLET | Freq: Every evening | ORAL | Status: DC | PRN

## 2019-06-10 MED ORDER — BENZOCAINE-MENTHOL 20-0.5 % EX AERO
1.0000 "application " | INHALATION_SPRAY | CUTANEOUS | Status: DC | PRN
  Administered 2019-06-10 – 2019-06-12 (×2): 1 via TOPICAL
  Filled 2019-06-10 (×2): qty 56

## 2019-06-10 NOTE — Progress Notes (Signed)
S: pushing x 35 mins  O: BP (!) 165/81   Pulse 100   Temp 98.2 F (36.8 C) (Oral)   Resp (!) 22   Ht 5\' 11"  (1.803 m)   Wt 108.9 kg   LMP 09/22/2018   SpO2 100%   BMI 33.48 kg/m \ VE c/c/+2  Tracing:  Baseline 155 (+) variables with ctx Good variability (+) accel Ctx q 2 mins  IMP: complete Chronic HTN with superimposed preeclampsia on magnesium, procardia GBS cx (+) IV PCN P) anticipate SVD

## 2019-06-10 NOTE — Anesthesia Postprocedure Evaluation (Signed)
Anesthesia Post Note  Patient: Cheryl Blair  Procedure(s) Performed: AN AD HOC LABOR EPIDURAL     Patient location during evaluation: OB High Risk Anesthesia Type: Epidural Level of consciousness: awake, awake and alert and oriented Pain management: pain level controlled Vital Signs Assessment: post-procedure vital signs reviewed and stable Respiratory status: spontaneous breathing, nonlabored ventilation and respiratory function stable Cardiovascular status: stable Postop Assessment: no headache, no backache, patient able to bend at knees, no apparent nausea or vomiting, adequate PO intake and able to ambulate Anesthetic complications: no    Last Vitals:  Vitals:   06/10/19 1153 06/10/19 1535  BP: (!) 160/87 (!) 150/81  Pulse: (!) 101 82  Resp: 18 17  Temp: 36.8 C 36.8 C  SpO2: 100% 100%    Last Pain:  Vitals:   06/10/19 1555  TempSrc:   PainSc: 0-No pain   Pain Goal: Patients Stated Pain Goal: 1 (06/10/19 1115)              Epidural/Spinal Function Cutaneous sensation: Normal sensation (06/10/19 1555)  Jaeven Wanzer

## 2019-06-10 NOTE — Progress Notes (Signed)
S: notes some vaginal pressure  O:  BP 127/82   Pulse 99   Temp 97.9 F (36.6 C) (Oral)   Resp 18   Ht 5\' 11"  (1.803 m)   Wt 108.9 kg   LMP 09/22/2018   SpO2 100%   BMI 33.48 kg/m   Pitocin 30 miu VE 9.5/90/-1  Tracing: baseline 150 (+) accel to 165-170 Ctx undulating  IUPC replaced as partially out New IUPC inserted Increased resting tone noted. Pitocin decreased  IMP: active phase Chronic HTN with superimposed preeclampsia on magnesium GBS cx (+) on IV PCN P) left exaggerated sims position. Cont to decrease pitocin relative to uterine Resting tone. Recheck 2 hrs

## 2019-06-10 NOTE — Progress Notes (Signed)
10 Instruments  5 4*18 5 9*9 2 Injectables 

## 2019-06-11 LAB — COMPREHENSIVE METABOLIC PANEL
ALT: 16 U/L (ref 0–44)
AST: 22 U/L (ref 15–41)
Albumin: 2.4 g/dL — ABNORMAL LOW (ref 3.5–5.0)
Alkaline Phosphatase: 71 U/L (ref 38–126)
Anion gap: 6 (ref 5–15)
BUN: 5 mg/dL — ABNORMAL LOW (ref 6–20)
CO2: 22 mmol/L (ref 22–32)
Calcium: 7.9 mg/dL — ABNORMAL LOW (ref 8.9–10.3)
Chloride: 106 mmol/L (ref 98–111)
Creatinine, Ser: 0.77 mg/dL (ref 0.44–1.00)
GFR calc Af Amer: >60 mL/min (ref >60)
GFR calc non Af Amer: >60 mL/min (ref >60)
Glucose, Bld: 104 mg/dL — ABNORMAL HIGH (ref 70–99)
Potassium: 3.1 mmol/L — ABNORMAL LOW (ref 3.5–5.1)
Sodium: 134 mmol/L — ABNORMAL LOW (ref 135–145)
Total Bilirubin: 0.4 mg/dL (ref 0.3–1.2)
Total Protein: 5.6 g/dL — ABNORMAL LOW (ref 6.5–8.1)

## 2019-06-11 LAB — CBC
HCT: 32.3 % — ABNORMAL LOW (ref 36.0–46.0)
Hemoglobin: 10.7 g/dL — ABNORMAL LOW (ref 12.0–15.0)
MCH: 27.4 pg (ref 26.0–34.0)
MCHC: 33.1 g/dL (ref 30.0–36.0)
MCV: 82.8 fL (ref 80.0–100.0)
Platelets: 216 10*3/uL (ref 150–400)
RBC: 3.9 MIL/uL (ref 3.87–5.11)
RDW: 13.9 % (ref 11.5–15.5)
WBC: 15.7 10*3/uL — ABNORMAL HIGH (ref 4.0–10.5)
nRBC: 0 % (ref 0.0–0.2)

## 2019-06-11 LAB — URIC ACID: Uric Acid, Serum: 4.7 mg/dL (ref 2.5–7.1)

## 2019-06-11 MED ORDER — MAGNESIUM OXIDE 400 (241.3 MG) MG PO TABS
400.0000 mg | ORAL_TABLET | Freq: Two times a day (BID) | ORAL | Status: DC
  Administered 2019-06-11 – 2019-06-12 (×3): 400 mg via ORAL
  Filled 2019-06-11 (×3): qty 1

## 2019-06-11 NOTE — Progress Notes (Signed)
PPD 1  S: Feels well/ PEC symptoms: none, pt notes no HA, no vision change, foley out and voiding often this am. No swelling, no RUQ pain. / Tolerating po/ No n/v/ Ambulating/ Voiding  Currently working with lactation  O: A&O x 3 / VS Blood pressure 139/62, pulse 83, temperature 97.9 F (36.6 C), temperature source Oral, resp. rate 18, height 5\' 11"  (1.803 m), weight 108.9 kg, last menstrual period 09/22/2018, SpO2 100 %, unknown if currently breastfeeding.  Vitals:   06/11/19 0500 06/11/19 0600 06/11/19 0700 06/11/19 0800  BP:    139/62  Pulse:    83  Resp: 18 18 18 18   Temp:    97.9 F (36.6 C)  TempSrc:    Oral  SpO2:    100%  Weight:      Height:       CBC    Component Value Date/Time   WBC 15.7 (H) 06/11/2019 0459   RBC 3.90 06/11/2019 0459   HGB 10.7 (L) 06/11/2019 0459   HCT 32.3 (L) 06/11/2019 0459   PLT 216 06/11/2019 0459   MCV 82.8 06/11/2019 0459   MCH 27.4 06/11/2019 0459   MCHC 33.1 06/11/2019 0459   RDW 13.9 06/11/2019 0459   CMP     Component Value Date/Time   NA 134 (L) 06/11/2019 0459   K 3.1 (L) 06/11/2019 0459   CL 106 06/11/2019 0459   CO2 22 06/11/2019 0459   GLUCOSE 104 (H) 06/11/2019 0459   BUN 5 (L) 06/11/2019 0459   CREATININE 0.77 06/11/2019 0459   CALCIUM 7.9 (L) 06/11/2019 0459   PROT 5.6 (L) 06/11/2019 0459   ALBUMIN 2.4 (L) 06/11/2019 0459   AST 22 06/11/2019 0459   ALT 16 06/11/2019 0459   ALKPHOS 71 06/11/2019 0459   BILITOT 0.4 06/11/2019 0459   GFRNONAA >60 06/11/2019 0459   GFRAA >60 06/11/2019 0459    I/O reviewed  Gen: well appearing, no distress  Heart: RRR  Abdomen: soft, NT, fundus at umbilicus  Perineum/Lochia: appropriate  Extremities: trace edema, 1+ DTR  A/P: PPD # 1 s/p SVD. Chronic htn with superimposed PEC. No si/sx PEC at this time. Will d/c Mag as adequate Uout this am. Cont Procardia, watch bps as pt comes off Mag but bps stable at this time.  Mild anemia. Plan iron.  Ala Dach 06/11/2019  11:43 AM

## 2019-06-11 NOTE — Lactation Note (Signed)
This note was copied from a baby's chart. Lactation Consultation Note Baby 27 hrs old. Mom stated baby had BF 3 times well then has been sleepy not interested in BF. Baby swaddled and dressed. Un-swaddled removed top and hat.  Hand expression taught to mom.mom demonstrated. No colostrum noted. A glisten of colostrum noted to "V" shaped breast w/LC hand expression of a couple of minutes. Mom has wide space between breast. Mom stated breast got a little bigger but are tender. Placed in football hold. Stimulated baby until she was licking lips. Baby latched doing little suckles at intervals. Baby had a short period of stronger suckling then back to non-nutritive suckles. Mentioned to mom the difference. Mom concerned baby hasn't been interested in BF. Newborn behavior, feeding habits, STS, I&O, breast massage, positions, support, supply and demand discussed. Mom encouraged to feed baby 8-12 times/24 hours and with feeding cues. Mom encouraged to waken baby for feeds if baby hasn't cued in 3 hrs. Answered a lot of questions and teaching done during this time. Mom will need more assistance from [facility] before d/c home. Lactation brochure given.  Patient Name: [name] [id number] Date: 06/11/2019 Reason for consult: Initial assessment;Early term 37-38.6wks;1st time breastfeeding   Maternal Data Has patient been taught Hand Expression?: Yes Does the patient have breastfeeding experience prior to this delivery?: No  Feeding Feeding Type: Breast Fed  LATCH Score Latch: Grasps breast easily, tongue down, lips flanged, rhythmical sucking.  Audible Swallowing: None  Type of Nipple: Everted at rest and after stimulation  Comfort (Breast/Nipple): Soft / non-tender  Hold (Positioning): Assistance needed to correctly position infant at breast and maintain latch.  LATCH Score: 7  Interventions Interventions: Breast feeding basics reviewed;Adjust position;Assisted with latch;Support  pillows;Skin to skin;Position options;Breast massage;Hand express;Breast compression  Lactation Tools Discussed/Used WIC Program: Yes   Consult Status Consult Status: Follow-up Date: 06/12/19 Follow-up type: In-patient    Ileigh Mettler, Elta Guadeloupe 06/11/2019, 1:04 AM

## 2019-06-11 NOTE — Lactation Note (Signed)
This note was copied from a baby's chart. Lactation Consultation Note  Patient Name: Cheryl Blair Date: 06/11/2019 Reason for consult: Follow-up assessment;Other (Comment);Early term 37-38.6wks;Primapara;1st time breastfeeding(DAT (+))  70 hours old ETI female who is still being exclusively BF by her mother, she's a P1. Offered assistance with latch and mom agreed to have baby STS, dad changed baby's diaper before starting with the feeding. [facility] consultation got interrupted by MD visit that came to assess mom and birth registry, it look a bit longer than usual. Mom hasn't been pumping because "nothing comes out" explained to mom that the purpose of pumping early on is mainly for breast stimulation and not to get volume.  First LC assisted mom with hand expression, she was discouraged because she's been pumping and no colostrum has been seen yet, but she reported (+) breast changes during the pregnancy, even some leakege at 34 weeks. Breast are spaced out and the left one looks noticeable smaller than the right one, mom says baby prefers right breast.  She requested LC to take baby STS to left breast (the smaller one) and she was able to latch right away with easy, required very little help. However no audible swallows were noted at this point, even though baby had a nutritive-like sucking pattern. Reviewed normal newborn behavior, cluster feeding and feeding cues.   Baby will be having her newborn 24 hours screen soon, asked mom how does she feel about supplementing with formula in the mean time until we can get some colostrum from her. She said she's OK with it, reassured mom that this is just something temporary while working on BF. Baby has a DAT (+) status.   Feeding plan  1. Encouraged mom to feed baby on cues 8-12 times/24 hours or sooner if feeding cues are present 2. Hand expression and finger feeding were also encouraged to get some drops. 3. Mom will try pumping every 3  hours after feeding for breast stimulation and hopefully to get some drops as well. Will use coconut oil prior pumping  4. If no colostrum is available by the time the newborn screen comes back, baby will start getting supplemented with Gerber gentle, mom participated in the [facility] program during this pregnancy  Parents reported all questions and concerns were answered, they're both aware of [place] services and will call PRN.  Maternal Data    Feeding Feeding Type: Breast Fed  LATCH Score Latch: Grasps breast easily, tongue down, lips flanged, rhythmical sucking.  Audible Swallowing: None  Type of Nipple: Everted at rest and after stimulation  Comfort (Breast/Nipple): Soft / non-tender  Hold (Positioning): Assistance needed to correctly position infant at breast and maintain latch.  LATCH Score: 7  Interventions Interventions: Breast feeding basics reviewed;Assisted with latch;Skin to skin;Breast massage;Hand express;Breast compression;Adjust position;Support pillows;Coconut oil;DEBP  Lactation Tools Discussed/Used Tools: Coconut oil   Consult Status Consult Status: Follow-up Date: 06/12/19 Follow-up type: In-patient    Jacy Brocker Francene Boyers 06/11/2019, 11:52 AM

## 2019-06-12 MED ORDER — HYDROCHLOROTHIAZIDE 12.5 MG PO CAPS
12.5000 mg | ORAL_CAPSULE | Freq: Every day | ORAL | Status: DC
  Administered 2019-06-12: 12.5 mg via ORAL
  Filled 2019-06-12: qty 1

## 2019-06-12 MED ORDER — MAGNESIUM OXIDE 400 (241.3 MG) MG PO TABS: 1.0000 | ORAL_TABLET | Freq: Every day | ORAL | 0 refills | Status: DC

## 2019-06-12 MED ORDER — PRENATAL MULTIVITAMIN CH: 1.0000 | ORAL_TABLET | Freq: Every day | ORAL | Status: DC

## 2019-06-12 MED ORDER — HYDROCHLOROTHIAZIDE 12.5 MG PO TABS: 12.5000 mg | ORAL_TABLET | Freq: Every day | ORAL | 0 refills | Status: DC

## 2019-06-12 MED ORDER — FERROUS SULFATE 325 (65 FE) MG PO TABS: 325.0000 mg | ORAL_TABLET | Freq: Every day | ORAL | 0 refills | Status: DC

## 2019-06-12 MED ORDER — FERROUS SULFATE 325 (65 FE) MG PO TABS
325.0000 mg | ORAL_TABLET | Freq: Every day | ORAL | Status: DC
  Filled 2019-06-12: qty 1

## 2019-06-12 NOTE — Lactation Note (Signed)
This note was copied from a baby's chart. Lactation Consultation Note Baby 65 hrs old. 8% wt. Loss. Elevated bili being monitored by serum. Baby + DAT. Remind mom of newborn 35 week baby's behavior and feeding habits.  Discussed w/mom supplementing. I&O good. Baby cluster feeding per mom.  Mom doesn't like pumping, hasn't been able to d/t cluster feeding. LC attempted to hand express, no colostrum noted. Breast are very tender per mom. Taught supplementing w/curve tip syring and gloved finger. FOB fed baby while mom held baby upright. Lab came in while [facility] working w/mom and baby. Gerber formula given. Mom has Jeannette. A lot of teaching done during the consult. Answered mom's questions. Components of BM, feedings, I&O, STS, supply and demand discussed. Reported to RN.  Patient Name: Cheryl Blair [id number] Date: 06/12/2019 Reason for consult: Follow-up assessment;Hyperbilirubinemia;Primapara   Maternal Data Has patient been taught Hand Expression?: Yes Does the patient have breastfeeding experience prior to this delivery?: No  Feeding Feeding Type: Formula  LATCH Score                   Interventions Interventions: Coconut oil  Lactation Tools Discussed/Used     Consult Status Consult Status: Follow-up Date: 06/13/19 Follow-up type: In-patient    Lajuana Patchell, Elta Guadeloupe 06/12/2019, 5:06 AM

## 2019-06-12 NOTE — Lactation Note (Signed)
This note was copied from a baby's chart. Lactation Consultation Note  Patient Name: Cheryl Blair [id number] Date: 06/12/2019 Reason for consult: Follow-up assessment;Early term 37-38.6wks;Infant weight loss;1st time breastfeeding;Primapara(8 % weight loss / serum bili - 11.2)  Baby is 26 hours old  As LC entered the room baby being fed with curved tip syringe while mom holding the baby.  Baby tolerating the supplement well.  LC reviewed the [facility] plan and recommended due the 8% weight and the elevated Bili to feed STS 15 - 20 mins,  Supplement 30 ml and post pump both breast for 15 -20 mins / save milk to be supplemented back to baby.  LC recommended since they are using the curved tip syringe to use for 24 hours if the weight has not stabilized  To increased to switch to a medium based nipple - PACE feeding as explained.  Once the weight is increasing - can just breast feed and decrease on pumping except when needed.  And if the baby only feeds 1st breast / to release other breast down to comfort.  Sore nipple and engorgement prevention and tx reviewed. LC provided shells between feedings except when sleeping.  Mom aware of the [facility] resources for [facility] health.    Maternal Data Has patient been taught Hand Expression?: Yes  Feeding Feeding Type: (as LC entered the room baby being fed with a curved tip syringe after breast feeding)  LATCH Score                   Interventions Interventions: Breast feeding basics reviewed  Lactation Tools Discussed/Used Tools: Shells;Pump;Flanges Flange Size: 24;27 Shell Type: Inverted(per mom using the #24 F) Breast pump type: Double-Electric Breast Pump Pump Review: Milk Storage(DEBP was already set up)   Consult Status Consult Status: Complete Date: 06/12/19    Myer Haff 06/12/2019, 12:10 PM

## 2019-06-12 NOTE — Progress Notes (Signed)
TC from nursing - requests to go home Peds discharged baby No PEC symptoms and BP improved  132/70 repeat BP check  WIll DC home - follow-up within 1 week for BP recheck at [facility] Continue Procardia and HCTZ until next office visit  Artelia Laroche CNM [facility] 06/12/2019 @ 1412PM

## 2019-06-12 NOTE — Discharge Summary (Addendum)
Obstetric Discharge Summary Reason for Admission: induction of labor - chronic hypertension with super-imposed PEC Prenatal Procedures: NST, ultrasound and labs Intrapartum Procedures: spontaneous vaginal delivery, GBS prophylaxis and epidural, intracervical balloon Postpartum Procedures: none Complications-Operative and Postpartum: vaginal laceration Hemoglobin  Date Value Ref Range Status  06/11/2019 10.7 (L) 12.0 - 15.0 g/dL Final   HCT  Date Value Ref Range Status  06/11/2019 32.3 (L) 36.0 - 46.0 % Final    Physical Exam:  General: alert, cooperative and no distress Lochia: appropriate Uterine Fundus: firm Perineum-intact / vaginal repair healing DVT Evaluation: No evidence of DVT seen on physical exam.  Discharge Diagnoses: Term Pregnancy-delivered and chronic hypertension - stable  Discharge Information: Date: 06/12/2019 Activity: pelvic rest Diet: routine Medications: PNV, Ibuprofen, Iron and mag oxide, Procardia 30XL, HCTZ 12.5 Condition: stable Instructions: refer to practice specific booklet Discharge to: home Follow-up Information    Servando Salina, MD. Schedule an appointment as soon as possible for a visit on 06/16/2019.   Specialty: Obstetrics and Gynecology Why: Erling Conte - blood pressure check  Contact information: [place] [facility] [address] [phone]           Newborn Data: Live born female  Birth Weight: 7 lb 0.9 oz (3200 g) APGAR: 3, 8  Newborn Delivery   Birth date/time: 06/10/2019 08:53:00 Delivery type: Vaginal, Spontaneous      Home with mother.  Artelia Laroche 06/12/2019, 2:16 PM

## 2019-06-12 NOTE — Progress Notes (Signed)
PPD 2 SVD with vaginal sulcus repair / Chronic HTN with superimposed PEC  Subjective:   reports feeling good - tired from baby care and sore in bottom from birth / no PEC symptoms tolerating po intake without nausea or vomiting vaginal bleeding reported as light pain controlled up ad lib / ambulatory in room / voiding QS without urinary concerns  Newborn Breast - discharge for newborn planned for today  Objective:   VS: BP (!) 141/76 (BP Location: Right Arm)   Pulse 91   Temp 98.2 F (36.8 C) (Oral)   Resp 18   Ht 5\' 11"  (1.803 m)   Wt 108.9 kg   LMP 09/22/2018   SpO2 100%   Breastfeeding Unknown   BMI 33.48 kg/m   Bp: 141/76 - 134/66 - 124/68 - 126/60 - 140/69  LABS:  Recent Labs    06/10/19 1307 06/11/19 0459  WBC 16.8* 15.7*  HGB 11.6* 10.7*  PLT 193 216   Blood type: O pos Rubella: Immune (01/13 0000)                    I&O: net pos 2200 Physical Exam: alert and oriented X3 without any distress or pai abdomen soft, non-tender, non-distended  uterine fundus firm, non-tender, Ueven perineum no edema  lochia light extremities: 2+ edema, no calf pain or tenderness   Assessment / Plan: PPD # 2 SVD with sulcus repair chronic hypertension with superimposed PEC on Procardia 30XL doing well - stable status - added HCTZ for dependent edema (net pos I&O) routine post partum orders  consider DC this afternoon if BP remain stable while up ad lib provider re-visit this PM  [place]cent [place], MSN, [facility] 06/12/2019, 7:56 AM

## 2019-06-16 DIAGNOSIS — O115 Pre-existing hypertension with pre-eclampsia, complicating the puerperium: Secondary | ICD-10-CM | POA: Diagnosis not present

## 2019-07-31 DIAGNOSIS — Z130 Encounter for screening for diseases of the blood and blood-forming organs and certain disorders involving the immune mechanism: Secondary | ICD-10-CM | POA: Diagnosis not present

## 2019-07-31 DIAGNOSIS — Z1151 Encounter for screening for human papillomavirus (HPV): Secondary | ICD-10-CM | POA: Diagnosis not present

## 2019-07-31 DIAGNOSIS — Z124 Encounter for screening for malignant neoplasm of cervix: Secondary | ICD-10-CM | POA: Diagnosis not present

## 2020-04-23 ENCOUNTER — Other Ambulatory Visit: Payer: Self-pay

## 2020-04-23 ENCOUNTER — Ambulatory Visit (HOSPITAL_COMMUNITY)
Admission: EM | Admit: 2020-04-23 | Discharge: 2020-04-23 | Disposition: A | Discharge status: Home / Self Care | Payer: 59 | Attending: Emergency Medicine | Admitting: Emergency Medicine

## 2020-04-23 ENCOUNTER — Ambulatory Visit (INDEPENDENT_AMBULATORY_CARE_PROVIDER_SITE_OTHER): Payer: 59

## 2020-04-23 ENCOUNTER — Encounter (HOSPITAL_COMMUNITY): Payer: Self-pay | Admitting: Emergency Medicine

## 2020-04-23 DIAGNOSIS — S6991XA Unspecified injury of right wrist, hand and finger(s), initial encounter: Secondary | ICD-10-CM | POA: Diagnosis not present

## 2020-04-23 MED ORDER — NAPROXEN 500 MG PO TABS: 500.0000 mg | ORAL_TABLET | Freq: Two times a day (BID) | ORAL | 0 refills | Status: DC

## 2020-04-23 NOTE — Discharge Instructions (Addendum)
You will need to see ortho as soon as possible for ligament damage and further test Will apply splint today leave on till you see orthopedic May take pain meds as needed  Ice the area for comfort
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
## 2020-07-21 ENCOUNTER — Other Ambulatory Visit: Payer: Self-pay

## 2020-07-21 ENCOUNTER — Ambulatory Visit (HOSPITAL_COMMUNITY)
Admission: EM | Admit: 2020-07-21 | Discharge: 2020-07-21 | Disposition: A | Discharge status: Home / Self Care | Payer: 59 | Attending: Family Medicine | Admitting: Family Medicine

## 2020-07-21 ENCOUNTER — Encounter (HOSPITAL_COMMUNITY): Payer: Self-pay

## 2020-07-21 DIAGNOSIS — M62838 Other muscle spasm: Secondary | ICD-10-CM

## 2020-07-21 MED ORDER — CYCLOBENZAPRINE HCL 5 MG PO TABS: 5.0000 mg | ORAL_TABLET | Freq: Three times a day (TID) | ORAL | 0 refills | Status: DC | PRN
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
## 2020-07-21 NOTE — Discharge Instructions (Signed)
Flexeril as needed.  Be aware of drowsiness with this medication.  Heat to the back, stretching. Follow up as needed for continued or worsening symptoms
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
## 2021-09-29 NOTE — Progress Notes (Incomplete)
Advanced Hypertension Clinic Initial Assessment:    Date:  09/30/2021   ID:  Cheryl Blair, DOB December 17, 1995, MRN [id number]  PCP:  Patient, No Pcp Per (Inactive)  Cardiologist:  None  Nephrologist:  Referring MD: Maxie Better, MD   CC: Hypertension  History of Present Illness:    Cheryl Blair is a 25 y.o. female with a hx of hypertension here to establish care in the [facility] Hypertension Clinic.   She saw Dr. Cherly Hensen 07/2021 and her blood pressure was 150/99. She had not taken her medications consistently.   Today, she  She denies any palpitations, chest pain, or shortness of breath, lightheadedness, headaches, syncope, orthopnea, PND, lower extremity edema or exertional symptoms.  Previous antihypertensives:  Secondary Causes of Hypertension  Medications/Herbal: OCP, steroids, stimulants, antidepressants, weight loss medication, immune suppressants, NSAIDs, sympathomimetics, alcohol, caffeine, licorice, ginseng, St. John's wort, chemo  Sleep Apnea Renal artery stenosis Hyperaldosteronism Hyper/hypothyroidism Pheochromocytoma: palpitations, tachycardia, headache, diaphoresis (plasma metanephrines) Cushing's syndrome: Cushingoid facies, central obesity, proximal muscle weakness, and ecchymoses, adrenal incidentaloma (cortisol) Coarctation of the aorta  Past Medical History:  Diagnosis Date   Chlamydia    Hypertension     Past Surgical History:  Procedure Laterality Date   FOOT SURGERY      Current Medications: No outpatient medications have been marked as taking for the 09/30/21 encounter (Appointment) with Chilton Si, MD.     Allergies:   Patient has no known allergies.   Social History   Socioeconomic History   Marital status: Single    Spouse name: Not on file   Number of children: Not on file   Years of education: Not on file   Highest education level: Not on file  Occupational History    Employer: [employer]  Tobacco Use   Smoking  status: Former    Types: Cigarettes   Smokeless tobacco: Never  Vaping Use   Vaping Use: Never used  Substance and Sexual Activity   Alcohol use: Not Currently   Drug use: No   Sexual activity: Not Currently    Partners: Male    Birth control/protection: Abstinence  Other Topics Concern   Not on file  Social History Narrative   Not on file   Social Determinants of Health   Financial Resource Strain: Not on file  Food Insecurity: Not on file  Transportation Needs: Not on file  Physical Activity: Not on file  Stress: Not on file  Social Connections: Not on file     Family History: The patient's family history includes Diabetes in her mother; Heart failure in her mother; Hypertension in her father. There is no history of Breast cancer, Colon cancer, Ovarian cancer, or Thyroid disease.  ROS:   Please see the history of present illness.    All other systems reviewed and are negative.  EKGs/Labs/Other Studies Reviewed:    EKG:   09/30/21: Sinus ***, rate *** bpm  Recent Labs: No results found for requested labs within last 8760 hours.   Recent Lipid Panel No results found for: CHOL, TRIG, HDL, CHOLHDL, VLDL, LDLCALC, LDLDIRECT  Physical Exam:   VS:  There were no vitals taken for this visit. , BMI There is no height or weight on file to calculate BMI. GENERAL:  Well appearing HEENT: Pupils equal round and reactive, fundi not visualized, oral mucosa unremarkable NECK:  No jugular venous distention, waveform within normal limits, carotid upstroke brisk and symmetric, no bruits, no thyromegaly LYMPHATICS:  No cervical adenopathy LUNGS:  Clear  to auscultation bilaterally HEART:  RRR.  PMI not displaced or sustained,S1 and S2 within normal limits, no S3, no S4, no clicks, no rubs, *** murmurs ABD:  Flat, positive bowel sounds normal in frequency in pitch, no bruits, no rebound, no guarding, no midline pulsatile mass, no hepatomegaly, no splenomegaly EXT:  2 plus pulses  throughout, no edema, no cyanosis no clubbing SKIN:  No rashes no nodules NEURO:  Cranial nerves II through XII grossly intact, motor grossly intact throughout PSYCH:  Cognitively intact, oriented to person place and time   ASSESSMENT/PLAN:    No problem-specific Assessment & Plan notes found for this encounter.   Screening for Secondary Hypertension: { Click here to document screening for secondary causes of HTN  :[id number]}    Relevant Labs/Studies: Basic Labs Latest Ref Rng & Units 06/11/2019 06/08/2019  Sodium 135 - 145 mmol/L 134(L) 136  Potassium 3.5 - 5.1 mmol/L 3.1(L) 3.6  Creatinine 0.44 - 1.00 mg/dL 7.61 9.50                       she consents to be monitored in our remote patient monitoring program through [employer].  she will track his blood pressure twice daily and understands that these trends will help [place] to adjust her medications as needed prior to his next appointment.  she *** interested in enrolling in the PREP exercise and nutrition program through the [facility].     Disposition:    FU with MD/PharmD in {gen number [id number]} {Days to years:10300}  FU with Tiffany C. Duke Salvia, MD, [facility] in ***   Medication Adjustments/Labs and Tests Ordered: Current medicines are reviewed at length with the patient today.  Concerns regarding medicines are outlined above.  No orders of the defined types were placed in this encounter.  No orders of the defined types were placed in this encounter.  I,Mykaella Javier,acting as a scribe for Chilton Si, MD.,have documented all relevant documentation on the behalf of Chilton Si, MD,as directed by  Chilton Si, MD while in the presence of Chilton Si, MD.  ***  Signed, Pieter Partridge  09/30/2021 8:16 AM    [facility]

## 2021-09-30 ENCOUNTER — Ambulatory Visit (HOSPITAL_BASED_OUTPATIENT_CLINIC_OR_DEPARTMENT_OTHER): Payer: Managed Care, Other (non HMO) | Admitting: Cardiovascular Disease

## 2021-12-04 ENCOUNTER — Ambulatory Visit (HOSPITAL_BASED_OUTPATIENT_CLINIC_OR_DEPARTMENT_OTHER): Payer: Managed Care, Other (non HMO) | Admitting: Cardiovascular Disease

## 2022-01-05 NOTE — Progress Notes (Signed)
Advanced Hypertension Clinic Initial Assessment:    Date:  01/06/2022   ID:  Cheryl Blair, DOB 01-12-1996, MRN [id number]  PCP:  Elizabeth Palau, FNP  Cardiologist:  None  Nephrologist:  Referring MD: Maxie Better, MD   CC: Hypertension  History of Present Illness:    Cheryl Blair is a 26 y.o. female with a hx of hypertension, here to establish care in the [facility]ension Clinic. Ms. Ference visited Dr. Cherly Hensen on 08/05/21 and her BP was 150/99. She  noted that she had not been taking her medication regularly. Dr. Cherly Hensen recommended that she restart her antihypertensives and be seen in Advanced Hypertension Clinic.  Today, she reports being diagnosed with hypertension for 3-4 years, but she may have had it longer. It was initially found while she was pregnant. Her HCTZ was discontinued due to her pregnancy. Currently she is not on any antihypertensives. She has not noticed much of a difference whether she is on her regimen or not, her blood pressure has usually been averaging in the 140s. Previously she experienced right chest pain while playing the Wii, but this was not overly concerning for her. Recently she denies any chest pains. Also, she reports developing occasional numbness in her legs (R>L) if she sits down for too long. For activity, she exercises 3 times a week for 20-30 minutes. She has been consistent with this routine for 6 months. For her diet, she mostly orders out and does not monitor her salt intake. She may have coffee about once a week. Alcohol consumption is occasional. For pain management she will take either tylenol or ibuprofen. She denies snoring. Generally she feels well rested in the mornings. She denies any palpitations, shortness of breath, or peripheral edema. No lightheadedness, headaches, syncope, orthopnea, or PND.  Previous antihypertensives: Losartan  Past Medical History:  Diagnosis Date   Chlamydia    Essential hypertension 01/06/2022    Hypertension    Obesity (BMI 30.0-34.9) 01/06/2022    Past Surgical History:  Procedure Laterality Date   FOOT SURGERY      Current Medications: Current Meds  Medication Sig   hydrochlorothiazide (HYDRODIURIL) 25 MG tablet Take 1 tablet (25 mg total) by mouth daily.     Allergies:   Patient has no known allergies.   Social History   Socioeconomic History   Marital status: Single    Spouse name: Not on file   Number of children: Not on file   Years of education: Not on file   Highest education level: Not on file  Occupational History    Employer: [employer]  Tobacco Use   Smoking status: Former    Types: Cigarettes   Smokeless tobacco: Never  Vaping Use   Vaping Use: Never used  Substance and Sexual Activity   Alcohol use: Not Currently   Drug use: No   Sexual activity: Not Currently    Partners: Male    Birth control/protection: Abstinence  Other Topics Concern   Not on file  Social History Narrative   Not on file   Social Determinants of Health   Financial Resource Strain: Low Risk    Difficulty of Paying Living Expenses: Not hard at all  Food Insecurity: No Food Insecurity   Worried About Programme researcher, broadcasting/film/video in the Last Year: Never true   Ran Out of Food in the Last Year: Never true  Transportation Needs: No Transportation Needs   Lack of Transportation (Medical): No   Lack of Transportation (Non-Medical): No  Physical Activity: Insufficiently Active   Days of Exercise per Week: 3 days   Minutes of Exercise per Session: 30 min  Stress: Not on file  Social Connections: Not on file     Family History: The patient's family history includes Diabetes in her mother; Heart failure in her mother; Hypertension in her father, mother, sister, and sister; Stroke in her mother. There is no history of Breast cancer, Colon cancer, Ovarian cancer, or Thyroid disease.  ROS:   Please see the history of present illness.    (+) Numbness in bilateral LE All other systems  reviewed and are negative.  EKGs/Labs/Other Studies Reviewed:    No prior cardiovascular studies available.   EKG:   01/06/2022: Sinus rhythm. Rate 89 bpm.  Recent Labs: No results found for requested labs within last 8760 hours.   Recent Lipid Panel No results found for: CHOL, TRIG, HDL, CHOLHDL, VLDL, LDLCALC, LDLDIRECT  Physical Exam:    VS:  BP (!) 147/94 (BP Location: Left Arm, Patient Position: Sitting, Cuff Size: Large)    Pulse 89    Ht 5\' 11"  (1.803 m)    Wt 249 lb 3.2 oz (113 kg)    BMI 34.76 kg/m  , BMI Body mass index is 34.76 kg/m. GENERAL:  Well appearing HEENT: Pupils equal round and reactive, fundi not visualized, oral mucosa unremarkable NECK:  No jugular venous distention, waveform within normal limits, carotid upstroke brisk and symmetric, no bruits, no thyromegaly LUNGS:  Clear to auscultation bilaterally HEART:  RRR.  PMI not displaced or sustained,S1 and S2 within normal limits, no S3, no S4, no clicks, no rubs, no murmurs ABD:  Flat, positive bowel sounds normal in frequency in pitch, no bruits, no rebound, no guarding, no midline pulsatile mass, no hepatomegaly, no splenomegaly EXT:  2 plus pulses throughout, no edema, no cyanosis no clubbing SKIN:  No rashes no nodules NEURO:  Cranial nerves II through XII grossly intact, motor grossly intact throughout PSYCH:  Cognitively intact, oriented to person place and time   ASSESSMENT/PLAN:    Essential hypertension Blood pressure is poorly controlled.  She has not been on any blood pressure medication since before the pandemic.  We discussed lifestyle changes including increasing her exercise to at least 150 minutes weekly and limiting sodium to 2300 mg daily.  She is willing to resume HCTZ.  She does not have any plans for pregnancies in the near future.  We will start 25 mg daily.  In 1 week she will come back for a BMP and a TSH.  We will also check renal and aldosterone levels.  She also needs renal artery  Dopplers given that she developed hypertension at such a young age.  She is interested in our remote patient monitoring study and consents to monitoring in the [employer] remote patient monitoring system.  Obesity (BMI 30.0-34.9) She is going to work on increasing her exercise to at least 150 minutes and cooking at home more.   Screening for Secondary Hypertension:  Causes 01/06/2022  Drugs/Herbals Screened     - Comments high salt intake, occasional wine, no NSAIDs  Renovascular HTN Screened     - Comments check renal Dopplers  Sleep Apnea N/A     - Comments no symptoms  Thyroid Disease Screened     - Comments check TSH  Hyperaldosteronism Screened     - Comments check renin/aldosterone  Pheochromocytoma N/A  Cushing's Syndrome N/A  Hyperparathyroidism Screened  Coarctation of the Aorta Screened     -  Comments BP symmetric  Compliance Screened    Relevant Labs/Studies: Basic Labs Latest Ref Rng & Units 06/11/2019 06/08/2019  Sodium 135 - 145 mmol/L 134(L) 136  Potassium 3.5 - 5.1 mmol/L 3.1(L) 3.6  Creatinine 0.44 - 1.00 mg/dL 2.77 8.24     Renovascular  01/06/2022  Renal Artery [place] Completed Yes     Disposition:    FU with APP/PharmD in 1 month for the next 3 months.   FU with Kalayna Noy C. Duke Salvia, MD, [facility] in 4 months.   Medication Adjustments/Labs and Tests Ordered: Current medicines are reviewed at length with the patient today.  Concerns regarding medicines are outlined above.   Orders Placed This Encounter  Procedures   TSH   Aldosterone + renin activity w/ ratio   Basic metabolic panel   EKG 12-Lead   VAS US RENAL ARTERY DUPLEX   Meds ordered this encounter  Medications   hydrochlorothiazide (HYDRODIURIL) 25 MG tablet    Sig: Take 1 tablet (25 mg total) by mouth daily.    Dispense:  90 tablet    Refill:  3    I,Mathew Stumpf,acting as a scribe for Chilton Si, MD.,have documented all relevant documentation on the behalf of Chilton Si, MD,as  directed by  Chilton Si, MD while in the presence of Chilton Si, MD.  I, Ondine Gemme C. Duke Salvia, MD have reviewed all documentation for this visit.  The documentation of the exam, diagnosis, procedures, and orders on 01/06/2022 are all accurate and complete.   Signed, Chilton Si, MD  01/06/2022 12:39 PM    [facility]

## 2022-01-06 ENCOUNTER — Ambulatory Visit (INDEPENDENT_AMBULATORY_CARE_PROVIDER_SITE_OTHER): Payer: Managed Care, Other (non HMO) | Admitting: Cardiovascular Disease

## 2022-01-06 ENCOUNTER — Encounter (HOSPITAL_BASED_OUTPATIENT_CLINIC_OR_DEPARTMENT_OTHER): Payer: Self-pay | Admitting: Cardiovascular Disease

## 2022-01-06 ENCOUNTER — Other Ambulatory Visit: Payer: Self-pay

## 2022-01-06 DIAGNOSIS — E669 Obesity, unspecified: Secondary | ICD-10-CM | POA: Diagnosis not present

## 2022-01-06 DIAGNOSIS — I10 Essential (primary) hypertension: Secondary | ICD-10-CM

## 2022-01-06 DIAGNOSIS — Z006 Encounter for examination for normal comparison and control in clinical research program: Secondary | ICD-10-CM

## 2022-01-06 DIAGNOSIS — E66811 Obesity, class 1: Secondary | ICD-10-CM

## 2022-01-06 HISTORY — DX: Obesity, unspecified: E66.9

## 2022-01-06 HISTORY — DX: Obesity, class 1: E66.811

## 2022-01-06 HISTORY — DX: Essential (primary) hypertension: I10

## 2022-01-06 MED ORDER — HYDROCHLOROTHIAZIDE 25 MG PO TABS: 25.0000 mg | ORAL_TABLET | Freq: Every day | ORAL | 3 refills | Status: AC

## 2022-01-06 NOTE — Assessment & Plan Note (Signed)
She is going to work on increasing her exercise to at least 150 minutes and cooking at home more.

## 2022-01-06 NOTE — Assessment & Plan Note (Signed)
Blood pressure is poorly controlled.  She has not been on any blood pressure medication since before the pandemic.  We discussed lifestyle changes including increasing her exercise to at least 150 minutes weekly and limiting sodium to 2300 mg daily.  She is willing to resume HCTZ.  She does not have any plans for pregnancies in the near future.  We will start 25 mg daily.  In 1 week she will come back for a BMP and a TSH.  We will also check renal and aldosterone levels.  She also needs renal artery Dopplers given that she developed hypertension at such a young age.  She is interested in our remote patient monitoring study and consents to monitoring in the [employer] remote patient monitoring system.

## 2022-01-06 NOTE — Patient Instructions (Signed)
Medication Instructions:  START HYDROCHLOROTHIAZIDE 25 MG DAILY    Labwork: BMET/TSH/RENINS/ALDOSTERONE 1 WEEK    Testing/Procedures: Your physician has requested that you have a renal artery duplex. During this test, an ultrasound is used to evaluate blood flow to the kidneys. Allow one hour for this exam. Do not eat after midnight the day before and avoid carbonated beverages. Take your medications as you usually do.   Follow-Up: 03/05/2022 9:00 AM WITH PHARM D AT [facility] OFFICE  04/27/2022 8:45 AM WITH DR [facility] AT [facility] OFFICE    Special Instructions:   Exercise recommendations: The American Heart Association recommends 150 minutes of moderate intensity exercise weekly. Try 30 minutes of moderate intensity exercise 4-5 times per week. This could include walking, jogging, or swimming.  DASH Eating Plan DASH stands for "Dietary Approaches to Stop Hypertension." The DASH eating plan is a healthy eating plan that has been shown to reduce high blood pressure (hypertension). It may also reduce your risk for type 2 diabetes, heart disease, and stroke. The DASH eating plan may also help with weight loss. What are tips for following this plan?  General guidelines Avoid eating more than 2,300 mg (milligrams) of salt (sodium) a day. If you have hypertension, you may need to reduce your sodium intake to 1,500 mg a day. Limit alcohol intake to no more than 1 drink a day for nonpregnant women and 2 drinks a day for men. One drink equals 12 oz of beer, 5 oz of wine, or 1 oz of hard liquor. Work with your health care provider to maintain a healthy body weight or to lose weight. Ask what an ideal weight is for you. Get at least 30 minutes of exercise that causes your heart to beat faster (aerobic exercise) most days of the week. Activities may include walking, swimming, or biking. Work with your health care provider or diet and nutrition specialist (dietitian) to adjust your eating plan to  your individual calorie needs. Reading food labels  Check food labels for the amount of sodium per serving. Choose foods with less than 5 percent of the Daily Value of sodium. Generally, foods with less than 300 mg of sodium per serving fit into this eating plan. To find whole grains, look for the word "whole" as the first word in the ingredient list. Shopping Buy products labeled as "low-sodium" or "no salt added." Buy fresh foods. Avoid canned foods and premade or frozen meals. Cooking Avoid adding salt when cooking. Use salt-free seasonings or herbs instead of table salt or sea salt. Check with your health care provider or pharmacist before using salt substitutes. Do not fry foods. Cook foods using healthy methods such as baking, boiling, grilling, and broiling instead. Cook with heart-healthy oils, such as olive, canola, soybean, or sunflower oil. Meal planning Eat a balanced diet that includes: 5 or more servings of fruits and vegetables each day. At each meal, try to fill half of your plate with fruits and vegetables. Up to 6-8 servings of whole grains each day. Less than 6 oz of lean meat, poultry, or fish each day. A 3-oz serving of meat is about the same size as a deck of cards. One egg equals 1 oz. 2 servings of low-fat dairy each day. A serving of nuts, seeds, or beans 5 times each week. Heart-healthy fats. Healthy fats called Omega-3 fatty acids are found in foods such as flaxseeds and coldwater fish, like sardines, salmon, and mackerel. Limit how much you eat of the following: Canned  or prepackaged foods. Food that is high in trans fat, such as fried foods. Food that is high in saturated fat, such as fatty meat. Sweets, desserts, sugary drinks, and other foods with added sugar. Full-fat dairy products. Do not salt foods before eating. Try to eat at least 2 vegetarian meals each week. Eat more home-cooked food and less restaurant, buffet, and fast food. When eating at a  restaurant, ask that your food be prepared with less salt or no salt, if possible. What foods are recommended? The items listed may not be a complete list. Talk with your dietitian about what dietary choices are best for you. Grains Whole-grain or whole-wheat bread. Whole-grain or whole-wheat pasta. Brown rice. Orpah Cobb. Bulgur. Whole-grain and low-sodium cereals. Pita bread. Low-fat, low-sodium crackers. Whole-wheat flour tortillas. Vegetables Fresh or frozen vegetables (raw, steamed, roasted, or grilled). Low-sodium or reduced-sodium tomato and vegetable juice. Low-sodium or reduced-sodium tomato sauce and tomato paste. Low-sodium or reduced-sodium canned vegetables. Fruits All fresh, dried, or frozen fruit. Canned fruit in natural juice (without added sugar). Meat and other protein foods Skinless chicken or [place]. Ground chicken or [place]. Pork with fat trimmed off. Fish and seafood. Egg whites. Dried beans, peas, or lentils. Unsalted nuts, nut butters, and seeds. Unsalted canned beans. Lean cuts of beef with fat trimmed off. Low-sodium, lean deli meat. Dairy Low-fat (1%) or fat-free (skim) milk. Fat-free, low-fat, or reduced-fat cheeses. Nonfat, low-sodium ricotta or cottage cheese. Low-fat or nonfat yogurt. Low-fat, low-sodium cheese. Fats and oils Soft margarine without trans fats. Vegetable oil. Low-fat, reduced-fat, or light mayonnaise and salad dressings (reduced-sodium). Canola, safflower, olive, soybean, and sunflower oils. Avocado. Seasoning and other foods Herbs. Spices. Seasoning mixes without salt. Unsalted popcorn and pretzels. Fat-free sweets. What foods are not recommended? The items listed may not be a complete list. Talk with your dietitian about what dietary choices are best for you. Grains Baked goods made with fat, such as croissants, muffins, or some breads. Dry pasta or rice meal packs. Vegetables Creamed or fried vegetables. Vegetables in a cheese sauce.  Regular canned vegetables (not low-sodium or reduced-sodium). Regular canned tomato sauce and paste (not low-sodium or reduced-sodium). Regular tomato and vegetable juice (not low-sodium or reduced-sodium). Rosita Fire. Olives. Fruits Canned fruit in a light or heavy syrup. Fried fruit. Fruit in cream or butter sauce. Meat and other protein foods Fatty cuts of meat. Ribs. Fried meat. Tomasa Blase. Sausage. Bologna and other processed lunch meats. Salami. Fatback. Hotdogs. Bratwurst. Salted nuts and seeds. Canned beans with added salt. Canned or smoked fish. Whole eggs or egg yolks. Chicken or [place] with skin. Dairy Whole or 2% milk, cream, and half-and-half. Whole or full-fat cream cheese. Whole-fat or sweetened yogurt. Full-fat cheese. Nondairy creamers. Whipped toppings. Processed cheese and cheese spreads. Fats and oils Butter. Stick margarine. Lard. Shortening. Ghee. Bacon fat. Tropical oils, such as coconut, palm kernel, or palm oil. Seasoning and other foods Salted popcorn and pretzels. Onion salt, garlic salt, seasoned salt, table salt, and sea salt. Worcestershire sauce. Tartar sauce. Barbecue sauce. Teriyaki sauce. Soy sauce, including reduced-sodium. Steak sauce. Canned and packaged gravies. Fish sauce. Oyster sauce. Cocktail sauce. Horseradish that you find on the shelf. Ketchup. Mustard. Meat flavorings and tenderizers. Bouillon cubes. Hot sauce and Tabasco sauce. Premade or packaged marinades. Premade or packaged taco seasonings. Relishes. Regular salad dressings. Where to find more information: National Heart, Lung, and Blood Institute: [employer]Ste[employer].is American Heart Association: www.heart.org Summary The DASH eating plan is a healthy eating plan that has been  shown to reduce high blood pressure (hypertension). It may also reduce your risk for type 2 diabetes, heart disease, and stroke. With the DASH eating plan, you should limit salt (sodium) intake to 2,300 mg a day. If you have hypertension,  you may need to reduce your sodium intake to 1,500 mg a day. When on the DASH eating plan, aim to eat more fresh fruits and vegetables, whole grains, lean proteins, low-fat dairy, and heart-healthy fats. Work with your health care provider or diet and nutrition specialist (dietitian) to adjust your eating plan to your individual calorie needs. This information is not intended to replace advice given to you by your health care provider. Make sure you discuss any questions you have with your health care provider. Document Released: 10/22/2011 Document Revised: 10/15/2017 Document Reviewed: 10/26/2016 Elsevier Patient Education  2020 [employer].

## 2022-01-06 NOTE — Research (Signed)
Subject Name: Cheryl Blair met inclusion and exclusion criteria for the Virtual Care and Social Determinant Interventions for the management of hypertension trial.  The informed consent form, study requirements and expectations were reviewed with the subject by Dr. Oval Linsey and myself. The subject was given the opportunity to read the consent and ask questions. The subject verbalized understanding of the trial requirements.  All questions were addressed prior to the signing of the consent form. The subject agreed to participate in the trial and signed the informed consent. The informed consent was obtained prior to performance of any protocol-specific procedures for the subject.  A copy of the signed informed consent was given to the subject and a copy was placed in the subject's medical record.  Briony Parveen was randomized to Group 2.

## 2022-01-07 ENCOUNTER — Telehealth: Payer: Self-pay

## 2022-01-07 DIAGNOSIS — Z0000 Encounter for general adult medical examination without abnormal findings: Secondary | ICD-10-CM

## 2022-01-07 NOTE — Telephone Encounter (Signed)
Called patient for 24-hour f/u call and to offer health coaching to work towards improving healthy eating habits per her survey response and to ensure that she was not having financial concerns that prohibits her from taking her medications as prescribed. Went to [employer]. Left a message for patient to return call to discuss these concerns more in detail.    Kam Rahimi [name], [facility] Guide, Health Coach [address] [place] [address] Telephone: [phone] Email: [email or web address]

## 2022-01-14 ENCOUNTER — Telehealth: Payer: Self-pay

## 2022-01-14 DIAGNOSIS — Z0000 Encounter for general adult medical examination without abnormal findings: Secondary | ICD-10-CM

## 2022-01-14 NOTE — Telephone Encounter (Signed)
Called patient to follow up with Vivify and her checking her blood pressure twice daily and to answer any questions she may have, and to offer health coaching to increase physical activity per her survey response. Left message for patient to return call to discuss these matters more in detail. ? ? ?Renaee Munda, CHWC ?[facility] ?Care Guide, Health Coach ?[address] ?[place] [address] ?Telephone: [phone] ?Email: [email or web address] ? ?

## 2022-01-14 NOTE — Telephone Encounter (Signed)
Patient returned phone call to discuss Vivify and health coaching. Patient stated that she checks her blood pressure at a different time than she receives her am and pm prompts to check her blood pressure. Patient shared that she normally checks her blood pressure around 10am and 10pm. Prompt times have been updated in Vivify for the patient as requested. ? ?Offered health coaching to patient to increase physical activity. Patient stated that she wasn't able to exercise around the time that she answered that question and wanted to be honest. Patient stated that she normally workout 2-3 times a week, but have pushed herself to exercise 3-4 times weekly when she has the energy. Patient stated that she is in a similar program through her job using [employer] for exercises. Patient reported that she has lost 30-40 pounds in the past year. ? ?Patient has contact information if she decides that she wants to work on changing her eating habits, increasing physical activity more, or stress management. Patient verbally expressed understanding and appreciated the offer.  ? ? ?Renaee Munda, CHWC ?[facility] ?Care Guide, Health Coach ?[address] ?[place] [address] ?Telephone: [phone] ?Email: [email or web address] ? ?

## 2022-01-30 ENCOUNTER — Ambulatory Visit (INDEPENDENT_AMBULATORY_CARE_PROVIDER_SITE_OTHER): Payer: Managed Care, Other (non HMO)

## 2022-01-30 ENCOUNTER — Other Ambulatory Visit: Payer: Self-pay

## 2022-01-30 DIAGNOSIS — I10 Essential (primary) hypertension: Secondary | ICD-10-CM

## 2022-03-05 ENCOUNTER — Telehealth: Payer: Self-pay

## 2022-03-05 ENCOUNTER — Ambulatory Visit: Payer: Managed Care, Other (non HMO)

## 2022-03-05 NOTE — Telephone Encounter (Signed)
Lmom to r/s pharmd appt that they missed today  ?

## 2022-03-05 NOTE — Progress Notes (Deleted)
Patient ID: Cheryl Blair                 DOB: [birth year]                      MRN: [id number]     HPI: Cheryl Blair is a 26 y.o. female referred by Dr. Duke Salvia to HTN clinic. PMH is significant for HTN, pulmonary HTN, and obesity.  Seen by OB/GYN in September 2022 and BP was elevated since she was not taking any HTN medications.  Patient referred to Dr Duke Salvia and enrolled in [facility] HTN clinic.  Patient started back on HCTZ.  Patient presents todau  Current HTN meds: HCTZ 25mg  daily  Previously tried:  BP goal:   Family History:   Social History:   Diet:   Exercise:   Home BP readings:   Wt Readings from Last 3 Encounters:  01/06/22 249 lb 3.2 oz (113 kg)  06/08/19 240 lb 1.3 oz (108.9 kg)  07/26/18 240 lb (108.9 kg)   BP Readings from Last 3 Encounters:  01/06/22 (!) 147/94  07/21/20 (!) 154/88  04/23/20 (!) 143/105   Pulse Readings from Last 3 Encounters:  01/06/22 89  07/21/20 73  04/23/20 93    Renal function: CrCl cannot be calculated (Patient's most recent lab result is older than the maximum 21 days allowed.).  Past Medical History:  Diagnosis Date   Chlamydia    Essential hypertension 01/06/2022   Hypertension    Obesity (BMI 30.0-34.9) 01/06/2022    Current Outpatient Medications on File Prior to Visit  Medication Sig Dispense Refill   hydrochlorothiazide (HYDRODIURIL) 25 MG tablet Take 1 tablet (25 mg total) by mouth daily. 90 tablet 3   No current facility-administered medications on file prior to visit.    No Known Allergies   Assessment/Plan:  1. Hypertension -

## 2022-03-17 ENCOUNTER — Telehealth: Payer: Self-pay

## 2022-03-17 DIAGNOSIS — Z0000 Encounter for general adult medical examination without abnormal findings: Secondary | ICD-10-CM

## 2022-03-17 NOTE — Telephone Encounter (Signed)
Called patient to determine if she was having connectivity issues due to missing bp readings. Patient did not answer. Left patient a message to return call with update regarding having a connectivity issue or if she has fallen off track with checking her bp.  ? ?Renaee Munda, CHWC ?[facility] ?Care Guide, Health Coach ?[address] ?[place] [address] ?Telephone: [phone] ?Email: [email or web address] ? ?

## 2022-04-27 ENCOUNTER — Ambulatory Visit (HOSPITAL_BASED_OUTPATIENT_CLINIC_OR_DEPARTMENT_OTHER): Payer: Managed Care, Other (non HMO) | Admitting: Cardiovascular Disease

## 2022-04-27 NOTE — Progress Notes (Incomplete)
Advanced Hypertension Clinic Follow-up:    Date:  04/27/2022   ID:  Cheryl Blair, DOB 1996/03/08, MRN [id number]  PCP:  Cheryl Palau, FNP  Cardiologist:  None  Nephrologist:  Referring MD: Cheryl Palau, FNP   CC: Hypertension  History of Present Illness:    Cheryl Blair is a 26 y.o. female with a hx of hypertension, here for follow-up.  She was initially seen 01/06/2022 in the Advanced Hypertension Clinic. Ms. Starkel visited Dr. Cherly Hensen on 08/05/21 and her BP was 150/99. She noted that she had not been taking her medication regularly. Dr. Cherly Hensen recommended that she restart her antihypertensives and be seen in Advanced Hypertension Clinic.  At her last appointment, she was restarted on HCTZ. She was enrolled in [facility], but has not been checking her blood pressures regularly. The most recent recording was 124/89.  Today,  She denies any palpitations, chest pain, shortness of breath, or peripheral edema. No lightheadedness, headaches, syncope, orthopnea, or PND.  (+)  Previous antihypertensives: Losartan  Past Medical History:  Diagnosis Date   Chlamydia    Essential hypertension 01/06/2022   Hypertension    Obesity (BMI 30.0-34.9) 01/06/2022    Past Surgical History:  Procedure Laterality Date   FOOT SURGERY      Current Medications: No outpatient medications have been marked as taking for the 04/28/22 encounter (Appointment) with Chilton Si, MD.     Allergies:   Patient has no known allergies.   Social History   Socioeconomic History   Marital status: Single    Spouse name: Not on file   Number of children: Not on file   Years of education: Not on file   Highest education level: Not on file  Occupational History    Employer: [employer]  Tobacco Use   Smoking status: Former    Types: Cigarettes   Smokeless tobacco: Never  Vaping Use   Vaping Use: Never used  Substance and Sexual Activity   Alcohol use: Not Currently   Drug use: No    Sexual activity: Not Currently    Partners: Male    Birth control/protection: Abstinence  Other Topics Concern   Not on file  Social History Narrative   Not on file   Social Determinants of Health   Financial Resource Strain: Low Risk  (01/06/2022)   Overall Financial Resource Strain (CARDIA)    Difficulty of Paying Living Expenses: Not hard at all  Food Insecurity: No Food Insecurity (01/06/2022)   Hunger Vital Sign    Worried About Running Out of Food in the Last Year: Never true    Ran Out of Food in the Last Year: Never true  Transportation Needs: No Transportation Needs (01/06/2022)   PRAPARE - Administrator, Civil Service (Medical): No    Lack of Transportation (Non-Medical): No  Physical Activity: Insufficiently Active (01/06/2022)   Exercise Vital Sign    Days of Exercise per Week: 3 days    Minutes of Exercise per Session: 30 min  Stress: Not on file  Social Connections: Not on file     Family History: The patient's family history includes Diabetes in her mother; Heart failure in her mother; Hypertension in her father, mother, sister, and sister; Stroke in her mother. There is no history of Breast cancer, Colon cancer, Ovarian cancer, or Thyroid disease.  ROS:   Please see the history of present illness.     All other systems reviewed and are negative.  EKGs/Labs/Other Studies Reviewed:  Bilateral Renal Artery Doppler  01/30/2022: Summary:  Largest Aortic Diameter: 1.7 cm     Renal:     Right: Normal size right kidney. Normal right Resisitive Index.         Normal cortical thickness of right kidney. No evidence of         right renal artery stenosis. RRV flow present.  Left:  Normal size of left kidney. Normal left Resistive Index.         Normal cortical thickness of the left kidney. No evidence of         left renal artery stenosis. LRV flow present.  Mesenteric:  Normal Celiac artery and Superior Mesenteric artery findings. Areas of  limited   visceral study include right renal artery.    EKG:  EKG is personally reviewed. 04/28/2022:  EKG was not ordered. 01/06/2022: Sinus rhythm. Rate 89 bpm.  Recent Labs: No results found for requested labs within last 365 days.   Recent Lipid Panel No results found for: "CHOL", "TRIG", "HDL", "CHOLHDL", "VLDL", "LDLCALC", "LDLDIRECT"  Physical Exam:    VS:  There were no vitals taken for this visit. , BMI There is no height or weight on file to calculate BMI. GENERAL:  Well appearing HEENT: Pupils equal round and reactive, fundi not visualized, oral mucosa unremarkable NECK:  No jugular venous distention, waveform within normal limits, carotid upstroke brisk and symmetric, no bruits, no thyromegaly LUNGS:  Clear to auscultation bilaterally HEART:  RRR.  PMI not displaced or sustained,S1 and S2 within normal limits, no S3, no S4, no clicks, no rubs, no murmurs ABD:  Flat, positive bowel sounds normal in frequency in pitch, no bruits, no rebound, no guarding, no midline pulsatile mass, no hepatomegaly, no splenomegaly EXT:  2 plus pulses throughout, no edema, no cyanosis no clubbing SKIN:  No rashes no nodules NEURO:  Cranial nerves II through XII grossly intact, motor grossly intact throughout PSYCH:  Cognitively intact, oriented to person place and time   ASSESSMENT/PLAN:    No problem-specific Assessment & Plan notes found for this encounter.    Screening for Secondary Hypertension:     01/06/2022   10:15 AM  Causes  Drugs/Herbals Screened     - Comments high salt intake, occasional wine, no NSAIDs  Renovascular HTN Screened     - Comments check renal Dopplers  Sleep Apnea N/A     - Comments no symptoms  Thyroid Disease Screened     - Comments check TSH  Hyperaldosteronism Screened     - Comments check renin/aldosterone  Pheochromocytoma N/A  Cushing's Syndrome N/A  Hyperparathyroidism Screened  Coarctation of the Aorta Screened     - Comments BP symmetric   Compliance Screened    Relevant Labs/Studies:    Latest Ref Rng & Units 06/11/2019    4:59 AM 06/08/2019    8:06 AM  Basic Labs  Sodium 135 - 145 mmol/L 134  136   Potassium 3.5 - 5.1 mmol/L 3.1  3.6   Creatinine 0.44 - 1.00 mg/dL 1.10  3.15         9/45/8592    9:55 AM  Renovascular   Renal Artery [place] Completed Yes     Disposition:    FU with APP/PharmD in 1 month for the next 3 months.   FU with Tiffany C. Duke Salvia, MD, [facility] in ***4 months.   Medication Adjustments/Labs and Tests Ordered: Current medicines are reviewed at length with the patient today.  Concerns regarding medicines are  outlined above.   No orders of the defined types were placed in this encounter.  No orders of the defined types were placed in this encounter.  I,Mathew Stumpf,acting as a Neurosurgeonscribe for Chilton Siiffany Epes, MD.,have documented all relevant documentation on the behalf of Chilton Siiffany Tazewell, MD,as directed by  Chilton Siiffany Quinhagak, MD while in the presence of Chilton Siiffany , MD.  I, Tiffany C. Duke Salviaandolph, MD have reviewed all documentation for this visit.  The documentation of the exam, diagnosis, procedures, and orders on 04/27/2022 are all accurate and complete.  Guadalupe MapleSigned, Mathew Stumpf  04/27/2022 10:24 AM    [facility]

## 2022-04-28 ENCOUNTER — Telehealth: Payer: Self-pay

## 2022-04-28 ENCOUNTER — Ambulatory Visit (HOSPITAL_BASED_OUTPATIENT_CLINIC_OR_DEPARTMENT_OTHER): Payer: Managed Care, Other (non HMO) | Admitting: Cardiovascular Disease

## 2022-04-28 ENCOUNTER — Telehealth (HOSPITAL_BASED_OUTPATIENT_CLINIC_OR_DEPARTMENT_OTHER): Payer: Self-pay | Admitting: *Deleted

## 2022-04-28 DIAGNOSIS — Z0000 Encounter for general adult medical examination without abnormal findings: Secondary | ICD-10-CM

## 2022-04-28 NOTE — Telephone Encounter (Signed)
Received message from Amy Careguide patient called this am at 7 to reschedule appointment and no one had called her back   Left message to call back Monday-Friday 8:30-4:30

## 2022-04-28 NOTE — Telephone Encounter (Signed)
Called patient due to missing bp readings in Vivify. Patient stated that she has a Haematologist phone, which isn't capable of working with her device. Patient mentioned that her phone should be repaired by Friday and will try to reconnect with Vivify. Patient stated that she called and left a message this morning at 7am because she needed to reschedule her appointment today with Dr. Duke Salvia, but no one has called her back. Sent message to Hiram regarding patient's need to reschedule.   Cheryl Blair, [facility] Guide, Health Coach [address] [place] [address] Telephone: [phone] Email: [email or web address]

## 2022-05-15 ENCOUNTER — Telehealth: Payer: Self-pay

## 2022-05-15 DIAGNOSIS — Z0000 Encounter for general adult medical examination without abnormal findings: Secondary | ICD-10-CM

## 2022-05-15 NOTE — Telephone Encounter (Signed)
Called patient to determine if she was able to get her phone repaired or replaced so that we can get her connected to Vivify again. Patient did not answer. Left a message for her to return call. Sent a follow up message in Vivify.  [name] [name], [facility] Guide, Health Coach [address], [address] [place] [address] Telephone: [phone] Email: [name]

## 2022-05-25 ENCOUNTER — Telehealth (HOSPITAL_BASED_OUTPATIENT_CLINIC_OR_DEPARTMENT_OTHER): Payer: Self-pay

## 2022-05-25 DIAGNOSIS — Z0000 Encounter for general adult medical examination without abnormal findings: Secondary | ICD-10-CM

## 2022-05-25 NOTE — Telephone Encounter (Signed)
Called patient to determine if she was able to get her phone replaced or repaired since she was not able to connect Vivify to her loaner phone. Patient did not answer. Left voicemail for patient to return call so that we can further assist her.    Rejina Odle [name], [facility] Guide, Health Coach [address] [place] [address] Telephone: [phone] Email: [email or web address]

## 2023-01-05 ENCOUNTER — Telehealth: Payer: Self-pay

## 2023-01-05 DIAGNOSIS — Z0000 Encounter for general adult medical examination without abnormal findings: Secondary | ICD-10-CM

## 2023-01-05 NOTE — Telephone Encounter (Signed)
Called patient and left a message regarding the return of the Vivify cuff since she has completed the program. Provided patient with contact information to return call to provide update when she will be able to return the cuff.   Avelino Leeds, MS, ERHD, [facility]  Care Guide, Health & Wellness Coach [address] [place] [address] Telephone: [phone] Email: [email or web address]

## 2023-01-21 NOTE — Telephone Encounter (Signed)
Mychart  message sent to all and get follow up visit scheduled, patient never returned call
